# Patient Record
Sex: Female | Born: 1965 | Race: White | Hispanic: No | Marital: Married | Smoking: Never smoker
Health system: Southern US, Community
[De-identification: ages and names within clinical notes are randomized; demographics above are authoritative.]

## PROBLEM LIST (undated history)

## (undated) DIAGNOSIS — J45909 Unspecified asthma, uncomplicated: Secondary | ICD-10-CM

## (undated) DIAGNOSIS — C801 Malignant (primary) neoplasm, unspecified: Secondary | ICD-10-CM

---

## 2007-12-28 ENCOUNTER — Other Ambulatory Visit: Admission: RE | Admit: 2007-12-28 | Discharge: 2007-12-28 | Payer: Self-pay | Admitting: Family Medicine

## 2009-04-26 ENCOUNTER — Other Ambulatory Visit: Admission: RE | Admit: 2009-04-26 | Discharge: 2009-04-26 | Payer: Self-pay | Admitting: Family Medicine

## 2010-08-14 ENCOUNTER — Other Ambulatory Visit: Admission: RE | Admit: 2010-08-14 | Discharge: 2010-08-14 | Payer: Self-pay | Admitting: Family Medicine

## 2010-12-16 ENCOUNTER — Other Ambulatory Visit: Payer: Self-pay | Admitting: Family Medicine

## 2010-12-16 DIAGNOSIS — Z1239 Encounter for other screening for malignant neoplasm of breast: Secondary | ICD-10-CM

## 2010-12-16 DIAGNOSIS — Z1231 Encounter for screening mammogram for malignant neoplasm of breast: Secondary | ICD-10-CM

## 2010-12-25 ENCOUNTER — Ambulatory Visit
Admission: RE | Admit: 2010-12-25 | Discharge: 2010-12-25 | Disposition: A | Discharge status: Home / Self Care | Payer: BC Managed Care – PPO | Source: Ambulatory Visit | Attending: Family Medicine | Admitting: Family Medicine

## 2010-12-25 DIAGNOSIS — Z1231 Encounter for screening mammogram for malignant neoplasm of breast: Secondary | ICD-10-CM

## 2012-05-24 ENCOUNTER — Other Ambulatory Visit (HOSPITAL_COMMUNITY)
Admission: RE | Admit: 2012-05-24 | Discharge: 2012-05-24 | Disposition: A | Discharge status: Home / Self Care | Payer: 59 | Source: Ambulatory Visit | Attending: Family Medicine | Admitting: Family Medicine

## 2012-05-24 DIAGNOSIS — Z124 Encounter for screening for malignant neoplasm of cervix: Secondary | ICD-10-CM | POA: Insufficient documentation

## 2012-05-24 DIAGNOSIS — Z1159 Encounter for screening for other viral diseases: Secondary | ICD-10-CM | POA: Insufficient documentation

## 2012-08-30 ENCOUNTER — Other Ambulatory Visit: Payer: Self-pay | Admitting: Family Medicine

## 2012-08-30 DIAGNOSIS — Z1231 Encounter for screening mammogram for malignant neoplasm of breast: Secondary | ICD-10-CM

## 2012-10-07 ENCOUNTER — Ambulatory Visit
Admission: RE | Admit: 2012-10-07 | Discharge: 2012-10-07 | Disposition: A | Discharge status: Home / Self Care | Payer: 59 | Source: Ambulatory Visit | Attending: Family Medicine | Admitting: Family Medicine

## 2012-10-07 DIAGNOSIS — Z1231 Encounter for screening mammogram for malignant neoplasm of breast: Secondary | ICD-10-CM

## 2014-11-21 ENCOUNTER — Other Ambulatory Visit: Payer: Self-pay | Admitting: Family Medicine

## 2014-11-21 DIAGNOSIS — R609 Edema, unspecified: Secondary | ICD-10-CM

## 2014-11-22 ENCOUNTER — Ambulatory Visit
Admission: RE | Admit: 2014-11-22 | Discharge: 2014-11-22 | Disposition: A | Discharge status: Home / Self Care | Payer: BLUE CROSS/BLUE SHIELD | Source: Ambulatory Visit | Attending: Family Medicine | Admitting: Family Medicine

## 2014-11-22 DIAGNOSIS — R609 Edema, unspecified: Secondary | ICD-10-CM

## 2016-02-27 ENCOUNTER — Other Ambulatory Visit: Payer: Self-pay

## 2016-02-27 DIAGNOSIS — Z1231 Encounter for screening mammogram for malignant neoplasm of breast: Secondary | ICD-10-CM

## 2016-03-06 DIAGNOSIS — L820 Inflamed seborrheic keratosis: Secondary | ICD-10-CM | POA: Diagnosis not present

## 2016-03-19 ENCOUNTER — Ambulatory Visit
Admission: RE | Admit: 2016-03-19 | Discharge: 2016-03-19 | Disposition: A | Discharge status: Home / Self Care | Payer: BLUE CROSS/BLUE SHIELD | Source: Ambulatory Visit

## 2016-03-19 DIAGNOSIS — Z1231 Encounter for screening mammogram for malignant neoplasm of breast: Secondary | ICD-10-CM | POA: Diagnosis not present

## 2016-03-21 ENCOUNTER — Other Ambulatory Visit: Payer: Self-pay | Admitting: Family Medicine

## 2016-03-21 DIAGNOSIS — R928 Other abnormal and inconclusive findings on diagnostic imaging of breast: Secondary | ICD-10-CM

## 2016-04-01 ENCOUNTER — Ambulatory Visit
Admission: RE | Admit: 2016-04-01 | Discharge: 2016-04-01 | Disposition: A | Discharge status: Home / Self Care | Payer: BLUE CROSS/BLUE SHIELD | Source: Ambulatory Visit | Attending: Family Medicine | Admitting: Family Medicine

## 2016-04-01 DIAGNOSIS — N6002 Solitary cyst of left breast: Secondary | ICD-10-CM | POA: Diagnosis not present

## 2016-04-01 DIAGNOSIS — R928 Other abnormal and inconclusive findings on diagnostic imaging of breast: Secondary | ICD-10-CM

## 2016-04-01 DIAGNOSIS — N63 Unspecified lump in breast: Secondary | ICD-10-CM | POA: Diagnosis not present

## 2016-04-30 DIAGNOSIS — M79662 Pain in left lower leg: Secondary | ICD-10-CM | POA: Diagnosis not present

## 2016-04-30 DIAGNOSIS — M25562 Pain in left knee: Secondary | ICD-10-CM | POA: Diagnosis not present

## 2016-04-30 DIAGNOSIS — M79605 Pain in left leg: Secondary | ICD-10-CM | POA: Diagnosis not present

## 2016-06-02 DIAGNOSIS — M79662 Pain in left lower leg: Secondary | ICD-10-CM | POA: Diagnosis not present

## 2016-06-02 DIAGNOSIS — M25562 Pain in left knee: Secondary | ICD-10-CM | POA: Diagnosis not present

## 2016-06-10 DIAGNOSIS — M25362 Other instability, left knee: Secondary | ICD-10-CM | POA: Diagnosis not present

## 2016-06-16 DIAGNOSIS — M25562 Pain in left knee: Secondary | ICD-10-CM | POA: Diagnosis not present

## 2016-06-16 DIAGNOSIS — M25362 Other instability, left knee: Secondary | ICD-10-CM | POA: Diagnosis not present

## 2016-06-16 DIAGNOSIS — M23352 Other meniscus derangements, posterior horn of lateral meniscus, left knee: Secondary | ICD-10-CM | POA: Diagnosis not present

## 2016-07-07 DIAGNOSIS — M25362 Other instability, left knee: Secondary | ICD-10-CM | POA: Diagnosis not present

## 2016-07-07 DIAGNOSIS — M23352 Other meniscus derangements, posterior horn of lateral meniscus, left knee: Secondary | ICD-10-CM | POA: Diagnosis not present

## 2016-07-07 DIAGNOSIS — M25562 Pain in left knee: Secondary | ICD-10-CM | POA: Diagnosis not present

## 2016-07-17 DIAGNOSIS — D1800 Hemangioma unspecified site: Secondary | ICD-10-CM | POA: Diagnosis not present

## 2016-07-17 DIAGNOSIS — L814 Other melanin hyperpigmentation: Secondary | ICD-10-CM | POA: Diagnosis not present

## 2016-07-17 DIAGNOSIS — D224 Melanocytic nevi of scalp and neck: Secondary | ICD-10-CM | POA: Diagnosis not present

## 2016-07-17 DIAGNOSIS — Z86018 Personal history of other benign neoplasm: Secondary | ICD-10-CM | POA: Diagnosis not present

## 2016-07-25 IMAGING — US US BREAST*L* LIMITED INC AXILLA
1 series · 5 of 5 positions shown · non-contrast
Comparison: 03/19/2016 and earlier priors

CLINICAL DATA: Mass identified on recent 2D screening mammogram.

EXAM:
2D DIGITAL DIAGNOSTIC LEFT MAMMOGRAM WITH CAD AND ADJUNCT TOMO
ULTRASOUND LEFT BREAST

[Series 1: us breast*left* limited inc axilla · 0.07mm/px · 5 of 5 slices shown]
[im 1/5]
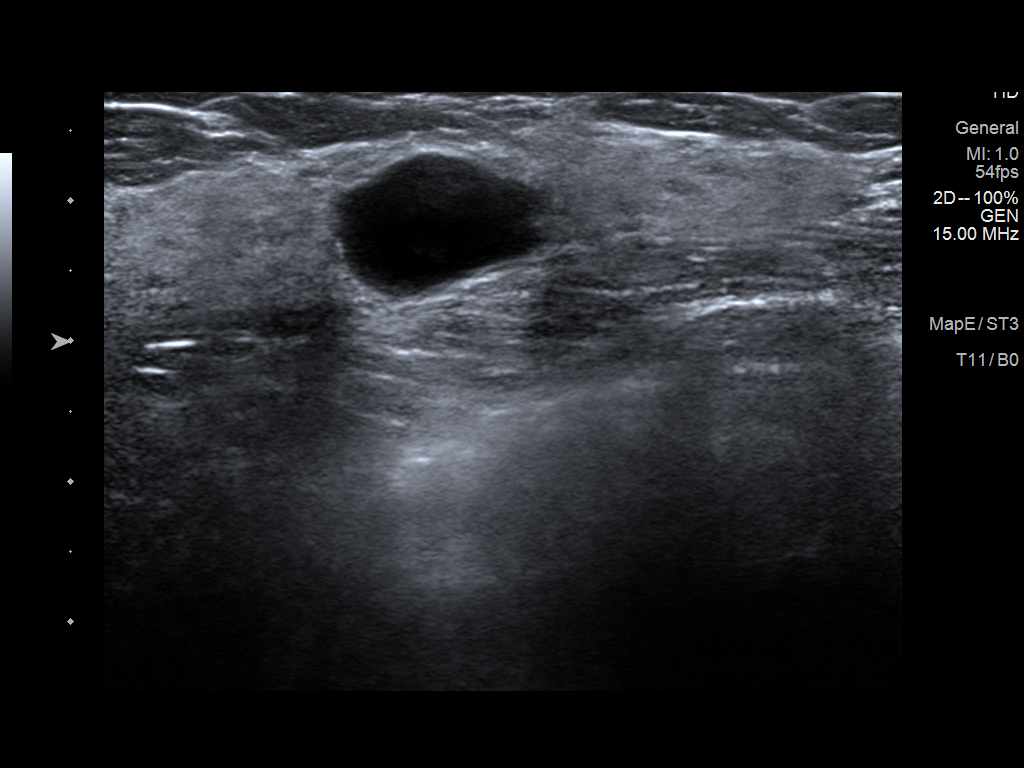
[im 2/5]
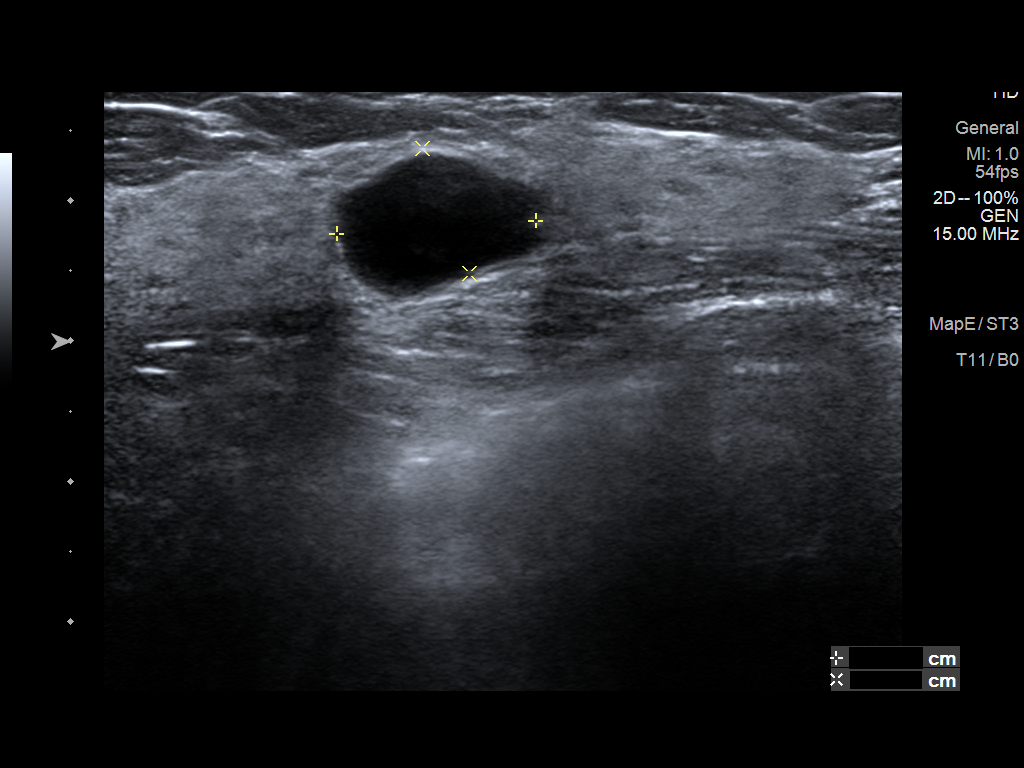
[im 3/5]
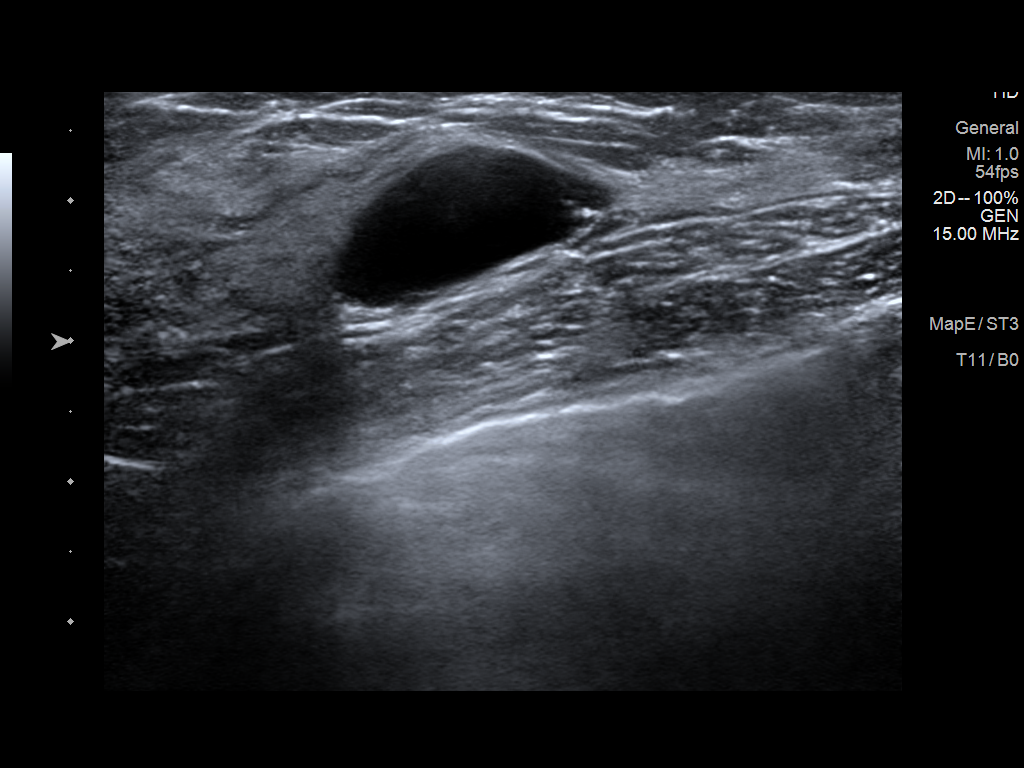
[im 4/5]
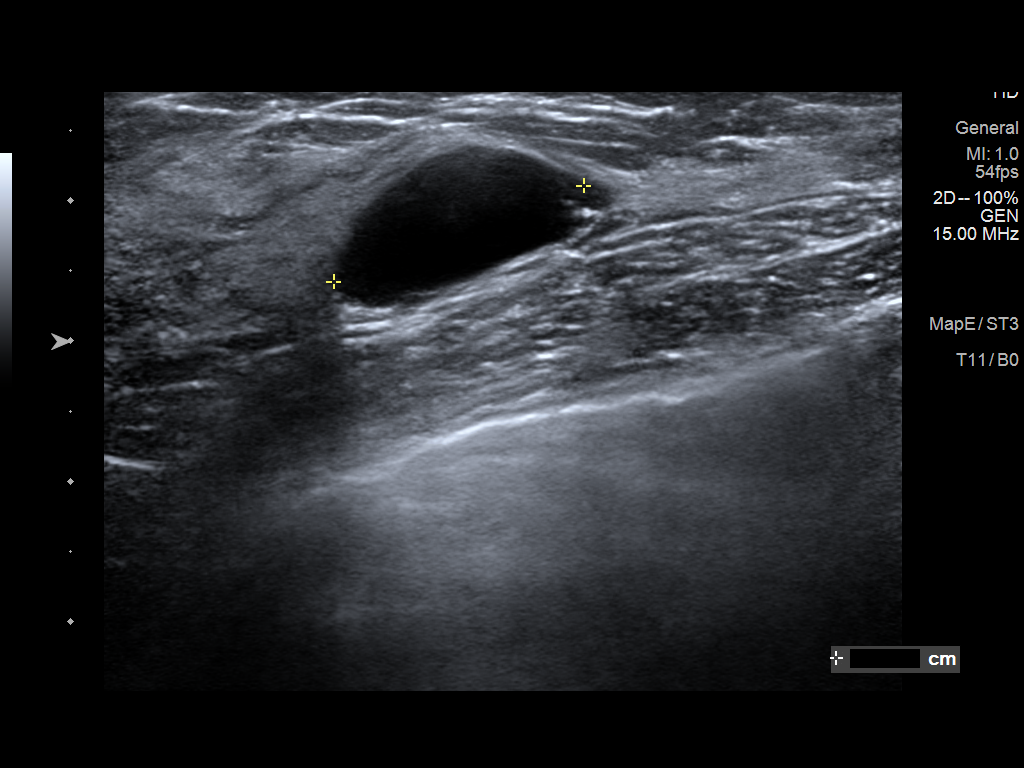
[im 5/5]
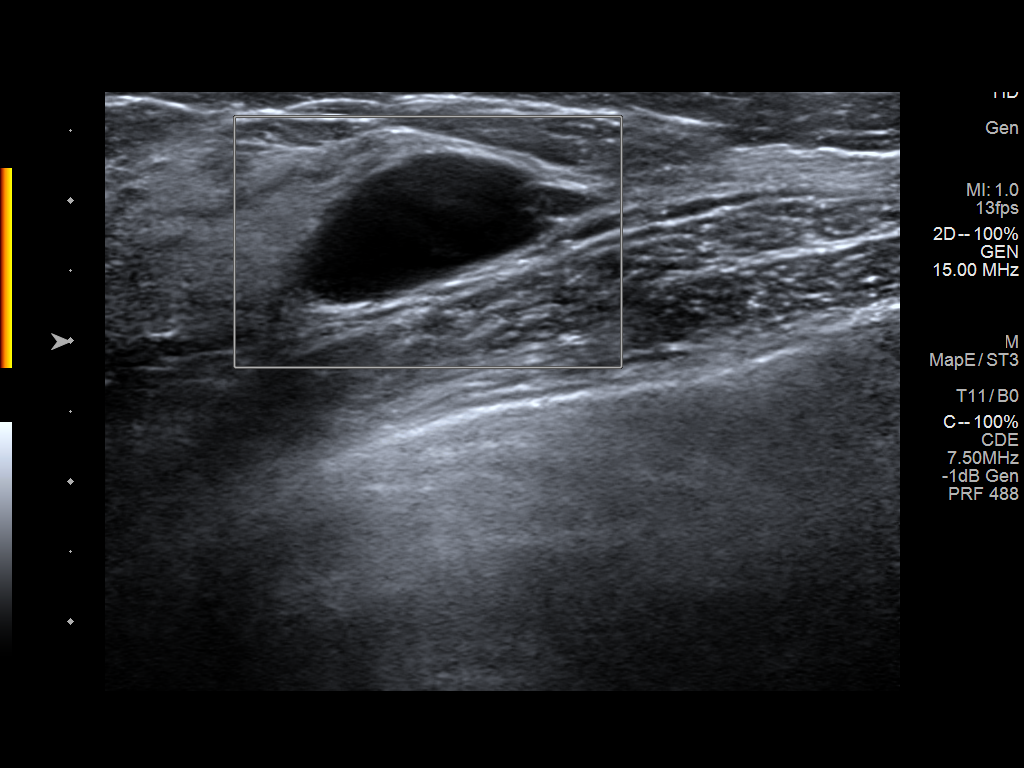

[5 of 5 positions shown; findings below may reference images not displayed]

ACR Breast Density Category c: The breast tissue is heterogeneously
dense, which may obscure small masses.
FINDINGS: Whole breast 3D images in MLO and CC projections and a 2D
exaggerated CC lateral projection of the left breast are performed.
There is a 20 mm oval circumscribed mass in the far outer left
breast. No distortion or additional masses are identified.

Mammographic images were processed with CAD.

On physical exam, there is a subtle, smooth palpable lump at [DATE]
position 5 cm from the nipple.

Targeted ultrasound is performed, showing an oval simple cyst at
[DATE] position 5 cm from the nipple measuring 19 x 14 x 10 mm.
IMPRESSION: Simple cysts left breast at [DATE] position. No evidence of malignancy
in the left breast.

RECOMMENDATION:
Screening mammogram in one year.(Code:[id number])

I have discussed the findings and recommendations with the patient.
Results were also provided in writing at the conclusion of the
visit. If applicable, a reminder letter will be sent to the patient
regarding the next appointment.

BI-RADS CATEGORY  2: Benign.

## 2017-01-15 DIAGNOSIS — Z6823 Body mass index (BMI) 23.0-23.9, adult: Secondary | ICD-10-CM | POA: Diagnosis not present

## 2017-01-15 DIAGNOSIS — N951 Menopausal and female climacteric states: Secondary | ICD-10-CM | POA: Diagnosis not present

## 2017-01-15 DIAGNOSIS — Z3009 Encounter for other general counseling and advice on contraception: Secondary | ICD-10-CM | POA: Diagnosis not present

## 2017-03-06 DIAGNOSIS — R5383 Other fatigue: Secondary | ICD-10-CM | POA: Diagnosis not present

## 2017-04-14 DIAGNOSIS — N840 Polyp of corpus uteri: Secondary | ICD-10-CM | POA: Diagnosis not present

## 2017-04-14 DIAGNOSIS — N938 Other specified abnormal uterine and vaginal bleeding: Secondary | ICD-10-CM | POA: Diagnosis not present

## 2018-04-01 DIAGNOSIS — Z23 Encounter for immunization: Secondary | ICD-10-CM | POA: Diagnosis not present

## 2018-04-01 DIAGNOSIS — D229 Melanocytic nevi, unspecified: Secondary | ICD-10-CM | POA: Diagnosis not present

## 2018-04-01 DIAGNOSIS — Z79899 Other long term (current) drug therapy: Secondary | ICD-10-CM | POA: Diagnosis not present

## 2018-04-01 DIAGNOSIS — J3089 Other allergic rhinitis: Secondary | ICD-10-CM | POA: Diagnosis not present

## 2018-04-01 DIAGNOSIS — F338 Other recurrent depressive disorders: Secondary | ICD-10-CM | POA: Diagnosis not present

## 2018-04-01 DIAGNOSIS — J4530 Mild persistent asthma, uncomplicated: Secondary | ICD-10-CM | POA: Diagnosis not present

## 2018-04-01 DIAGNOSIS — E7800 Pure hypercholesterolemia, unspecified: Secondary | ICD-10-CM | POA: Diagnosis not present

## 2018-04-23 DIAGNOSIS — Z6827 Body mass index (BMI) 27.0-27.9, adult: Secondary | ICD-10-CM | POA: Diagnosis not present

## 2018-04-23 DIAGNOSIS — R87610 Atypical squamous cells of undetermined significance on cytologic smear of cervix (ASC-US): Secondary | ICD-10-CM | POA: Diagnosis not present

## 2018-04-23 DIAGNOSIS — Z01419 Encounter for gynecological examination (general) (routine) without abnormal findings: Secondary | ICD-10-CM | POA: Diagnosis not present

## 2018-04-23 DIAGNOSIS — Z1231 Encounter for screening mammogram for malignant neoplasm of breast: Secondary | ICD-10-CM | POA: Diagnosis not present

## 2018-05-11 DIAGNOSIS — N840 Polyp of corpus uteri: Secondary | ICD-10-CM | POA: Diagnosis not present

## 2018-05-11 DIAGNOSIS — N926 Irregular menstruation, unspecified: Secondary | ICD-10-CM | POA: Diagnosis not present

## 2018-05-11 DIAGNOSIS — Z3202 Encounter for pregnancy test, result negative: Secondary | ICD-10-CM | POA: Diagnosis not present

## 2018-05-31 DIAGNOSIS — R87611 Atypical squamous cells cannot exclude high grade squamous intraepithelial lesion on cytologic smear of cervix (ASC-H): Secondary | ICD-10-CM | POA: Diagnosis not present

## 2018-05-31 DIAGNOSIS — R87810 Cervical high risk human papillomavirus (HPV) DNA test positive: Secondary | ICD-10-CM | POA: Diagnosis not present

## 2018-06-01 DIAGNOSIS — R87610 Atypical squamous cells of undetermined significance on cytologic smear of cervix (ASC-US): Secondary | ICD-10-CM | POA: Diagnosis not present

## 2018-12-10 DIAGNOSIS — R87610 Atypical squamous cells of undetermined significance on cytologic smear of cervix (ASC-US): Secondary | ICD-10-CM | POA: Diagnosis not present

## 2019-04-28 DIAGNOSIS — Z6824 Body mass index (BMI) 24.0-24.9, adult: Secondary | ICD-10-CM | POA: Diagnosis not present

## 2019-04-28 DIAGNOSIS — Z01419 Encounter for gynecological examination (general) (routine) without abnormal findings: Secondary | ICD-10-CM | POA: Diagnosis not present

## 2019-04-28 DIAGNOSIS — Z1151 Encounter for screening for human papillomavirus (HPV): Secondary | ICD-10-CM | POA: Diagnosis not present

## 2019-04-28 DIAGNOSIS — Z1231 Encounter for screening mammogram for malignant neoplasm of breast: Secondary | ICD-10-CM | POA: Diagnosis not present

## 2019-05-12 DIAGNOSIS — N6342 Unspecified lump in left breast, subareolar: Secondary | ICD-10-CM | POA: Diagnosis not present

## 2019-05-12 DIAGNOSIS — N6322 Unspecified lump in the left breast, upper inner quadrant: Secondary | ICD-10-CM | POA: Diagnosis not present

## 2019-05-12 DIAGNOSIS — R928 Other abnormal and inconclusive findings on diagnostic imaging of breast: Secondary | ICD-10-CM | POA: Diagnosis not present

## 2019-05-12 DIAGNOSIS — N6489 Other specified disorders of breast: Secondary | ICD-10-CM | POA: Diagnosis not present

## 2019-05-12 DIAGNOSIS — R922 Inconclusive mammogram: Secondary | ICD-10-CM | POA: Diagnosis not present

## 2019-05-19 DIAGNOSIS — N6322 Unspecified lump in the left breast, upper inner quadrant: Secondary | ICD-10-CM | POA: Diagnosis not present

## 2019-05-19 DIAGNOSIS — C50912 Malignant neoplasm of unspecified site of left female breast: Secondary | ICD-10-CM | POA: Diagnosis not present

## 2019-05-19 DIAGNOSIS — Z170 Estrogen receptor positive status [ER+]: Secondary | ICD-10-CM | POA: Diagnosis not present

## 2019-05-19 DIAGNOSIS — C50812 Malignant neoplasm of overlapping sites of left female breast: Secondary | ICD-10-CM | POA: Diagnosis not present

## 2019-05-19 DIAGNOSIS — R928 Other abnormal and inconclusive findings on diagnostic imaging of breast: Secondary | ICD-10-CM | POA: Diagnosis not present

## 2019-05-19 DIAGNOSIS — N6002 Solitary cyst of left breast: Secondary | ICD-10-CM | POA: Diagnosis not present

## 2019-05-19 DIAGNOSIS — N6325 Unspecified lump in the left breast, overlapping quadrants: Secondary | ICD-10-CM | POA: Diagnosis not present

## 2019-05-19 DIAGNOSIS — C50412 Malignant neoplasm of upper-outer quadrant of left female breast: Secondary | ICD-10-CM | POA: Diagnosis not present

## 2019-05-27 DIAGNOSIS — C50912 Malignant neoplasm of unspecified site of left female breast: Secondary | ICD-10-CM | POA: Diagnosis not present

## 2019-05-30 DIAGNOSIS — Z170 Estrogen receptor positive status [ER+]: Secondary | ICD-10-CM | POA: Diagnosis not present

## 2019-05-30 DIAGNOSIS — C50919 Malignant neoplasm of unspecified site of unspecified female breast: Secondary | ICD-10-CM | POA: Diagnosis not present

## 2019-05-30 DIAGNOSIS — C50912 Malignant neoplasm of unspecified site of left female breast: Secondary | ICD-10-CM | POA: Diagnosis not present

## 2019-05-31 ENCOUNTER — Other Ambulatory Visit: Payer: Self-pay | Admitting: Hematology

## 2019-06-01 DIAGNOSIS — C50919 Malignant neoplasm of unspecified site of unspecified female breast: Secondary | ICD-10-CM | POA: Diagnosis not present

## 2019-06-01 DIAGNOSIS — C50912 Malignant neoplasm of unspecified site of left female breast: Secondary | ICD-10-CM | POA: Diagnosis not present

## 2019-06-01 DIAGNOSIS — Z803 Family history of malignant neoplasm of breast: Secondary | ICD-10-CM | POA: Diagnosis not present

## 2019-06-03 DIAGNOSIS — Z853 Personal history of malignant neoplasm of breast: Secondary | ICD-10-CM | POA: Diagnosis not present

## 2019-06-03 DIAGNOSIS — C50912 Malignant neoplasm of unspecified site of left female breast: Secondary | ICD-10-CM | POA: Diagnosis not present

## 2019-06-14 DIAGNOSIS — Z01812 Encounter for preprocedural laboratory examination: Secondary | ICD-10-CM | POA: Diagnosis not present

## 2019-06-14 DIAGNOSIS — F329 Major depressive disorder, single episode, unspecified: Secondary | ICD-10-CM | POA: Diagnosis not present

## 2019-06-14 DIAGNOSIS — Z22321 Carrier or suspected carrier of Methicillin susceptible Staphylococcus aureus: Secondary | ICD-10-CM | POA: Diagnosis not present

## 2019-06-14 DIAGNOSIS — Z86718 Personal history of other venous thrombosis and embolism: Secondary | ICD-10-CM | POA: Diagnosis not present

## 2019-06-14 DIAGNOSIS — C50912 Malignant neoplasm of unspecified site of left female breast: Secondary | ICD-10-CM | POA: Diagnosis not present

## 2019-06-14 DIAGNOSIS — Z01818 Encounter for other preprocedural examination: Secondary | ICD-10-CM | POA: Diagnosis not present

## 2019-06-14 DIAGNOSIS — Z01810 Encounter for preprocedural cardiovascular examination: Secondary | ICD-10-CM | POA: Diagnosis not present

## 2019-06-14 DIAGNOSIS — J4520 Mild intermittent asthma, uncomplicated: Secondary | ICD-10-CM | POA: Diagnosis not present

## 2019-06-20 DIAGNOSIS — Z1159 Encounter for screening for other viral diseases: Secondary | ICD-10-CM | POA: Diagnosis not present

## 2019-06-20 DIAGNOSIS — J45909 Unspecified asthma, uncomplicated: Secondary | ICD-10-CM | POA: Diagnosis not present

## 2019-06-20 DIAGNOSIS — C50912 Malignant neoplasm of unspecified site of left female breast: Secondary | ICD-10-CM | POA: Diagnosis not present

## 2019-06-20 DIAGNOSIS — F418 Other specified anxiety disorders: Secondary | ICD-10-CM | POA: Diagnosis not present

## 2019-06-20 DIAGNOSIS — Z01812 Encounter for preprocedural laboratory examination: Secondary | ICD-10-CM | POA: Diagnosis not present

## 2019-06-24 DIAGNOSIS — Z853 Personal history of malignant neoplasm of breast: Secondary | ICD-10-CM | POA: Diagnosis not present

## 2019-06-24 DIAGNOSIS — J45909 Unspecified asthma, uncomplicated: Secondary | ICD-10-CM | POA: Diagnosis not present

## 2019-06-24 DIAGNOSIS — F329 Major depressive disorder, single episode, unspecified: Secondary | ICD-10-CM | POA: Diagnosis not present

## 2019-06-24 DIAGNOSIS — C773 Secondary and unspecified malignant neoplasm of axilla and upper limb lymph nodes: Secondary | ICD-10-CM | POA: Diagnosis not present

## 2019-06-24 DIAGNOSIS — C50912 Malignant neoplasm of unspecified site of left female breast: Secondary | ICD-10-CM | POA: Diagnosis not present

## 2019-06-24 DIAGNOSIS — D0512 Intraductal carcinoma in situ of left breast: Secondary | ICD-10-CM | POA: Diagnosis not present

## 2019-06-24 DIAGNOSIS — Z86718 Personal history of other venous thrombosis and embolism: Secondary | ICD-10-CM | POA: Diagnosis not present

## 2019-06-24 DIAGNOSIS — C50212 Malignant neoplasm of upper-inner quadrant of left female breast: Secondary | ICD-10-CM | POA: Diagnosis not present

## 2019-06-24 DIAGNOSIS — Z79899 Other long term (current) drug therapy: Secondary | ICD-10-CM | POA: Diagnosis not present

## 2019-06-24 DIAGNOSIS — F419 Anxiety disorder, unspecified: Secondary | ICD-10-CM | POA: Diagnosis not present

## 2019-06-24 DIAGNOSIS — C50812 Malignant neoplasm of overlapping sites of left female breast: Secondary | ICD-10-CM | POA: Diagnosis not present

## 2019-06-24 DIAGNOSIS — Z803 Family history of malignant neoplasm of breast: Secondary | ICD-10-CM | POA: Diagnosis not present

## 2019-06-24 DIAGNOSIS — Z170 Estrogen receptor positive status [ER+]: Secondary | ICD-10-CM | POA: Diagnosis not present

## 2019-06-24 HISTORY — PX: MASTECTOMY: SHX3

## 2019-06-25 DIAGNOSIS — C773 Secondary and unspecified malignant neoplasm of axilla and upper limb lymph nodes: Secondary | ICD-10-CM | POA: Diagnosis not present

## 2019-06-25 DIAGNOSIS — Z79899 Other long term (current) drug therapy: Secondary | ICD-10-CM | POA: Diagnosis not present

## 2019-06-25 DIAGNOSIS — Z170 Estrogen receptor positive status [ER+]: Secondary | ICD-10-CM | POA: Diagnosis not present

## 2019-06-25 DIAGNOSIS — Z86718 Personal history of other venous thrombosis and embolism: Secondary | ICD-10-CM | POA: Diagnosis not present

## 2019-06-25 DIAGNOSIS — C50912 Malignant neoplasm of unspecified site of left female breast: Secondary | ICD-10-CM | POA: Diagnosis not present

## 2019-06-25 DIAGNOSIS — F329 Major depressive disorder, single episode, unspecified: Secondary | ICD-10-CM | POA: Diagnosis not present

## 2019-06-25 DIAGNOSIS — Z803 Family history of malignant neoplasm of breast: Secondary | ICD-10-CM | POA: Diagnosis not present

## 2019-06-25 DIAGNOSIS — F419 Anxiety disorder, unspecified: Secondary | ICD-10-CM | POA: Diagnosis not present

## 2019-06-25 DIAGNOSIS — J45909 Unspecified asthma, uncomplicated: Secondary | ICD-10-CM | POA: Diagnosis not present

## 2019-06-27 DIAGNOSIS — C50912 Malignant neoplasm of unspecified site of left female breast: Secondary | ICD-10-CM | POA: Diagnosis not present

## 2019-07-01 DIAGNOSIS — I82442 Acute embolism and thrombosis of left tibial vein: Secondary | ICD-10-CM | POA: Diagnosis not present

## 2019-07-01 DIAGNOSIS — I82409 Acute embolism and thrombosis of unspecified deep veins of unspecified lower extremity: Secondary | ICD-10-CM | POA: Diagnosis not present

## 2019-07-01 DIAGNOSIS — I82432 Acute embolism and thrombosis of left popliteal vein: Secondary | ICD-10-CM | POA: Diagnosis not present

## 2019-07-01 DIAGNOSIS — I82412 Acute embolism and thrombosis of left femoral vein: Secondary | ICD-10-CM | POA: Diagnosis not present

## 2019-07-06 DIAGNOSIS — G478 Other sleep disorders: Secondary | ICD-10-CM | POA: Diagnosis not present

## 2019-07-06 DIAGNOSIS — Z9012 Acquired absence of left breast and nipple: Secondary | ICD-10-CM | POA: Diagnosis not present

## 2019-07-06 DIAGNOSIS — M25512 Pain in left shoulder: Secondary | ICD-10-CM | POA: Diagnosis not present

## 2019-07-06 DIAGNOSIS — R29898 Other symptoms and signs involving the musculoskeletal system: Secondary | ICD-10-CM | POA: Diagnosis not present

## 2019-07-07 DIAGNOSIS — C50812 Malignant neoplasm of overlapping sites of left female breast: Secondary | ICD-10-CM | POA: Diagnosis not present

## 2019-07-07 DIAGNOSIS — Z9012 Acquired absence of left breast and nipple: Secondary | ICD-10-CM | POA: Diagnosis not present

## 2019-07-07 DIAGNOSIS — C50011 Malignant neoplasm of nipple and areola, right female breast: Secondary | ICD-10-CM | POA: Diagnosis not present

## 2019-07-07 DIAGNOSIS — N644 Mastodynia: Secondary | ICD-10-CM | POA: Diagnosis not present

## 2019-07-07 DIAGNOSIS — Z170 Estrogen receptor positive status [ER+]: Secondary | ICD-10-CM | POA: Diagnosis not present

## 2019-07-08 DIAGNOSIS — C773 Secondary and unspecified malignant neoplasm of axilla and upper limb lymph nodes: Secondary | ICD-10-CM | POA: Diagnosis not present

## 2019-07-08 DIAGNOSIS — C50912 Malignant neoplasm of unspecified site of left female breast: Secondary | ICD-10-CM | POA: Diagnosis not present

## 2019-07-08 DIAGNOSIS — Z9012 Acquired absence of left breast and nipple: Secondary | ICD-10-CM | POA: Diagnosis not present

## 2019-07-13 DIAGNOSIS — R29898 Other symptoms and signs involving the musculoskeletal system: Secondary | ICD-10-CM | POA: Diagnosis not present

## 2019-07-13 DIAGNOSIS — M25512 Pain in left shoulder: Secondary | ICD-10-CM | POA: Diagnosis not present

## 2019-07-13 DIAGNOSIS — Z9012 Acquired absence of left breast and nipple: Secondary | ICD-10-CM | POA: Diagnosis not present

## 2019-07-13 DIAGNOSIS — G478 Other sleep disorders: Secondary | ICD-10-CM | POA: Diagnosis not present

## 2019-07-15 DIAGNOSIS — Z9012 Acquired absence of left breast and nipple: Secondary | ICD-10-CM | POA: Diagnosis not present

## 2019-07-15 DIAGNOSIS — C50912 Malignant neoplasm of unspecified site of left female breast: Secondary | ICD-10-CM | POA: Diagnosis not present

## 2019-07-18 DIAGNOSIS — H5310 Unspecified subjective visual disturbances: Secondary | ICD-10-CM | POA: Diagnosis not present

## 2019-07-20 DIAGNOSIS — Z170 Estrogen receptor positive status [ER+]: Secondary | ICD-10-CM | POA: Diagnosis not present

## 2019-07-20 DIAGNOSIS — C50912 Malignant neoplasm of unspecified site of left female breast: Secondary | ICD-10-CM | POA: Diagnosis not present

## 2019-07-21 DIAGNOSIS — R29898 Other symptoms and signs involving the musculoskeletal system: Secondary | ICD-10-CM | POA: Diagnosis not present

## 2019-07-21 DIAGNOSIS — Z9012 Acquired absence of left breast and nipple: Secondary | ICD-10-CM | POA: Diagnosis not present

## 2019-07-21 DIAGNOSIS — M25512 Pain in left shoulder: Secondary | ICD-10-CM | POA: Diagnosis not present

## 2019-07-26 ENCOUNTER — Other Ambulatory Visit: Payer: Self-pay | Admitting: Hematology

## 2019-07-26 DIAGNOSIS — J45909 Unspecified asthma, uncomplicated: Secondary | ICD-10-CM | POA: Diagnosis not present

## 2019-07-26 DIAGNOSIS — I824Y2 Acute embolism and thrombosis of unspecified deep veins of left proximal lower extremity: Secondary | ICD-10-CM | POA: Diagnosis not present

## 2019-07-26 DIAGNOSIS — C50912 Malignant neoplasm of unspecified site of left female breast: Secondary | ICD-10-CM | POA: Diagnosis not present

## 2019-07-26 DIAGNOSIS — Z86718 Personal history of other venous thrombosis and embolism: Secondary | ICD-10-CM | POA: Diagnosis not present

## 2019-07-26 DIAGNOSIS — Z79899 Other long term (current) drug therapy: Secondary | ICD-10-CM | POA: Diagnosis not present

## 2019-07-26 DIAGNOSIS — Z170 Estrogen receptor positive status [ER+]: Secondary | ICD-10-CM | POA: Diagnosis not present

## 2019-07-26 DIAGNOSIS — Z7901 Long term (current) use of anticoagulants: Secondary | ICD-10-CM | POA: Diagnosis not present

## 2019-07-28 DIAGNOSIS — R29898 Other symptoms and signs involving the musculoskeletal system: Secondary | ICD-10-CM | POA: Diagnosis not present

## 2019-07-28 DIAGNOSIS — M25512 Pain in left shoulder: Secondary | ICD-10-CM | POA: Diagnosis not present

## 2019-07-28 DIAGNOSIS — Z9012 Acquired absence of left breast and nipple: Secondary | ICD-10-CM | POA: Diagnosis not present

## 2019-07-29 ENCOUNTER — Telehealth: Payer: Self-pay | Admitting: Hematology

## 2019-07-29 NOTE — Telephone Encounter (Signed)
Spoke with patient confirmed 9/16 new patient visit. Date/time/self referred from [facility] per Dr. Burr Medico.
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
## 2019-08-01 DIAGNOSIS — C50812 Malignant neoplasm of overlapping sites of left female breast: Secondary | ICD-10-CM | POA: Insufficient documentation

## 2019-08-02 ENCOUNTER — Other Ambulatory Visit: Payer: Self-pay | Admitting: Hematology

## 2019-08-02 DIAGNOSIS — C770 Secondary and unspecified malignant neoplasm of lymph nodes of head, face and neck: Secondary | ICD-10-CM | POA: Diagnosis not present

## 2019-08-02 DIAGNOSIS — I82412 Acute embolism and thrombosis of left femoral vein: Secondary | ICD-10-CM | POA: Diagnosis not present

## 2019-08-02 DIAGNOSIS — C50912 Malignant neoplasm of unspecified site of left female breast: Secondary | ICD-10-CM | POA: Diagnosis not present

## 2019-08-03 ENCOUNTER — Other Ambulatory Visit: Payer: Self-pay

## 2019-08-03 ENCOUNTER — Inpatient Hospital Stay: Payer: BC Managed Care – PPO | Attending: Hematology | Admitting: Hematology

## 2019-08-03 ENCOUNTER — Encounter: Payer: Self-pay | Admitting: Hematology

## 2019-08-03 DIAGNOSIS — I82442 Acute embolism and thrombosis of left tibial vein: Secondary | ICD-10-CM

## 2019-08-03 DIAGNOSIS — Z7901 Long term (current) use of anticoagulants: Secondary | ICD-10-CM | POA: Insufficient documentation

## 2019-08-03 DIAGNOSIS — I82412 Acute embolism and thrombosis of left femoral vein: Secondary | ICD-10-CM | POA: Diagnosis not present

## 2019-08-03 DIAGNOSIS — Z9012 Acquired absence of left breast and nipple: Secondary | ICD-10-CM

## 2019-08-03 DIAGNOSIS — C773 Secondary and unspecified malignant neoplasm of axilla and upper limb lymph nodes: Secondary | ICD-10-CM | POA: Diagnosis not present

## 2019-08-03 DIAGNOSIS — Z23 Encounter for immunization: Secondary | ICD-10-CM | POA: Insufficient documentation

## 2019-08-03 DIAGNOSIS — I82432 Acute embolism and thrombosis of left popliteal vein: Secondary | ICD-10-CM

## 2019-08-03 DIAGNOSIS — Z170 Estrogen receptor positive status [ER+]: Secondary | ICD-10-CM

## 2019-08-03 DIAGNOSIS — Z5189 Encounter for other specified aftercare: Secondary | ICD-10-CM | POA: Diagnosis not present

## 2019-08-03 DIAGNOSIS — C50812 Malignant neoplasm of overlapping sites of left female breast: Secondary | ICD-10-CM

## 2019-08-03 DIAGNOSIS — F418 Other specified anxiety disorders: Secondary | ICD-10-CM | POA: Diagnosis not present

## 2019-08-03 DIAGNOSIS — Z803 Family history of malignant neoplasm of breast: Secondary | ICD-10-CM | POA: Diagnosis not present

## 2019-08-03 DIAGNOSIS — Z5111 Encounter for antineoplastic chemotherapy: Secondary | ICD-10-CM | POA: Insufficient documentation

## 2019-08-03 MED ORDER — INFLUENZA VAC SPLIT QUAD 0.5 ML IM SUSY
PREFILLED_SYRINGE | INTRAMUSCULAR | Status: AC
  Filled 2019-08-03: qty 0.5

## 2019-08-03 MED ORDER — INFLUENZA VAC SPLIT QUAD 0.5 ML IM SUSY
0.5000 mL | PREFILLED_SYRINGE | Freq: Once | INTRAMUSCULAR | Status: AC
  Administered 2019-08-03: 13:00:00 0.5 mL via INTRAMUSCULAR

## 2019-08-04 ENCOUNTER — Telehealth: Payer: Self-pay

## 2019-08-04 ENCOUNTER — Telehealth: Payer: Self-pay | Admitting: Hematology

## 2019-08-04 NOTE — Telephone Encounter (Signed)
Error - duplicate

## 2019-08-04 NOTE — Telephone Encounter (Signed)
No los per 9/16

## 2019-08-04 NOTE — Telephone Encounter (Signed)
Faxed OV note to Dr. Rex Kras fax [phone] and Dr. Jackson Latino [phone]

## 2019-08-06 NOTE — Progress Notes (Signed)
START ON PATHWAY REGIMEN - Breast     A cycle is every 21 days:     Docetaxel      Cyclophosphamide   **Always confirm dose/schedule in your pharmacy ordering system**  Patient Characteristics: Postoperative without Neoadjuvant Therapy (Pathologic Staging), Invasive Disease, Adjuvant Therapy, HER2 Negative/Unknown/Equivocal, ER Positive, Node Positive, Node Positive (1-3), Oncotyping Ordered, Oncotype Intermediate Risk (11 - 25), Chemotherapy  Preferred Therapeutic Status: Postoperative without Neoadjuvant Therapy (Pathologic Staging) AJCC Grade: G2 AJCC N Category: pN1a AJCC M Category: cM0 ER Status: Positive (+) AJCC 8 Stage Grouping: IA HER2 Status: Negative (-) Oncotype Dx Recurrence Score: 25 AJCC T Category: pT1c PR Status: Positive (+) Has this patient completed genomic testing<= Yes - Oncotype DX(R) Treatment Preferred: Chemotherapy Intent of Therapy: Curative Intent, Discussed with Patient

## 2019-08-08 ENCOUNTER — Telehealth: Payer: Self-pay | Admitting: *Deleted

## 2019-08-08 NOTE — Telephone Encounter (Signed)
Spoke with patient to assess navigation needs and discuss dignicap.  Gave instructions on next steps with dignicap and answered questions. Gave information on hairtostay and hopeforhair Patient will start the process and order her kit and card.  Informed her that if she does not receive her kit and card before Friday 9/25 that we have extra and she can replace it when hers arrives.  Patient verbalized understanding.  Scheduled chemo education class for 9/22 at 2pm.  Contact information given and encouraged her to call with any questions or needs.

## 2019-08-09 ENCOUNTER — Inpatient Hospital Stay: Payer: BC Managed Care – PPO

## 2019-08-09 ENCOUNTER — Other Ambulatory Visit: Payer: Self-pay

## 2019-08-09 ENCOUNTER — Encounter: Payer: Self-pay | Admitting: Hematology

## 2019-08-09 ENCOUNTER — Other Ambulatory Visit: Payer: Self-pay | Admitting: *Deleted

## 2019-08-09 DIAGNOSIS — Z5189 Encounter for other specified aftercare: Secondary | ICD-10-CM | POA: Diagnosis not present

## 2019-08-09 DIAGNOSIS — F418 Other specified anxiety disorders: Secondary | ICD-10-CM | POA: Diagnosis not present

## 2019-08-09 DIAGNOSIS — C50812 Malignant neoplasm of overlapping sites of left female breast: Secondary | ICD-10-CM

## 2019-08-09 DIAGNOSIS — Z170 Estrogen receptor positive status [ER+]: Secondary | ICD-10-CM

## 2019-08-09 DIAGNOSIS — I82432 Acute embolism and thrombosis of left popliteal vein: Secondary | ICD-10-CM | POA: Diagnosis not present

## 2019-08-09 DIAGNOSIS — Z23 Encounter for immunization: Secondary | ICD-10-CM | POA: Diagnosis not present

## 2019-08-09 DIAGNOSIS — I82412 Acute embolism and thrombosis of left femoral vein: Secondary | ICD-10-CM | POA: Diagnosis not present

## 2019-08-09 DIAGNOSIS — I82442 Acute embolism and thrombosis of left tibial vein: Secondary | ICD-10-CM | POA: Diagnosis not present

## 2019-08-09 DIAGNOSIS — Z7901 Long term (current) use of anticoagulants: Secondary | ICD-10-CM | POA: Diagnosis not present

## 2019-08-09 DIAGNOSIS — C773 Secondary and unspecified malignant neoplasm of axilla and upper limb lymph nodes: Secondary | ICD-10-CM | POA: Diagnosis not present

## 2019-08-09 DIAGNOSIS — Z803 Family history of malignant neoplasm of breast: Secondary | ICD-10-CM | POA: Diagnosis not present

## 2019-08-09 DIAGNOSIS — Z5111 Encounter for antineoplastic chemotherapy: Secondary | ICD-10-CM | POA: Diagnosis not present

## 2019-08-09 LAB — CBC WITH DIFFERENTIAL (CANCER CENTER ONLY)
Abs Immature Granulocytes: 0.02 10*3/uL (ref 0.00–0.07)
Basophils Absolute: 0 10*3/uL (ref 0.0–0.1)
Basophils Relative: 0 %
Eosinophils Absolute: 0.1 10*3/uL (ref 0.0–0.5)
Eosinophils Relative: 3 %
HCT: 39.9 % (ref 36.0–46.0)
Hemoglobin: 12.9 g/dL (ref 12.0–15.0)
Immature Granulocytes: 0 %
Lymphocytes Relative: 31 %
Lymphs Abs: 1.5 10*3/uL (ref 0.7–4.0)
MCH: 30 pg (ref 26.0–34.0)
MCHC: 32.3 g/dL (ref 30.0–36.0)
MCV: 92.8 fL (ref 80.0–100.0)
Monocytes Absolute: 0.5 10*3/uL (ref 0.1–1.0)
Monocytes Relative: 10 %
Neutro Abs: 2.7 10*3/uL (ref 1.7–7.7)
Neutrophils Relative %: 56 %
Platelet Count: 249 10*3/uL (ref 150–400)
RBC: 4.3 MIL/uL (ref 3.87–5.11)
RDW: 13.2 % (ref 11.5–15.5)
WBC Count: 4.8 10*3/uL (ref 4.0–10.5)
nRBC: 0 % (ref 0.0–0.2)

## 2019-08-09 LAB — CMP (CANCER CENTER ONLY)
ALT: 26 U/L (ref 0–44)
AST: 19 U/L (ref 15–41)
Albumin: 4.3 g/dL (ref 3.5–5.0)
Alkaline Phosphatase: 103 U/L (ref 38–126)
Anion gap: 11 (ref 5–15)
BUN: 12 mg/dL (ref 6–20)
CO2: 27 mmol/L (ref 22–32)
Calcium: 9.4 mg/dL (ref 8.9–10.3)
Chloride: 104 mmol/L (ref 98–111)
Creatinine: 0.9 mg/dL (ref 0.44–1.00)
GFR, Est AFR Am: >60 mL/min (ref >60)
GFR, Estimated: >60 mL/min (ref >60)
Glucose, Bld: 121 mg/dL — ABNORMAL HIGH (ref 70–99)
Potassium: 4.5 mmol/L (ref 3.5–5.1)
Sodium: 142 mmol/L (ref 135–145)
Total Bilirubin: 0.3 mg/dL (ref 0.3–1.2)
Total Protein: 7 g/dL (ref 6.5–8.1)

## 2019-08-09 MED ORDER — PROCHLORPERAZINE MALEATE 10 MG PO TABS: 10.0000 mg | ORAL_TABLET | Freq: Four times a day (QID) | ORAL | 0 refills | Status: DC | PRN

## 2019-08-09 MED ORDER — ONDANSETRON HCL 8 MG PO TABS: 8.0000 mg | ORAL_TABLET | Freq: Two times a day (BID) | ORAL | 1 refills | Status: DC

## 2019-08-09 MED ORDER — DEXAMETHASONE 4 MG PO TABS: 4.0000 mg | ORAL_TABLET | Freq: Two times a day (BID) | ORAL | 1 refills | Status: DC

## 2019-08-09 NOTE — Progress Notes (Signed)
Met with patient before class to introduce myself as Arboriculturist and to offer available resources.  Discussed one-time $1000 Radio broadcast assistant to assist with personal expenses while going through treatment.  Also mentioned available copay assistance if needed for diagnosis/specifice drugs.  Gave her my card if interested in applying and for any additional financial questions or concerns.
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
## 2019-08-12 ENCOUNTER — Other Ambulatory Visit: Payer: Self-pay

## 2019-08-12 ENCOUNTER — Encounter: Payer: Self-pay | Admitting: Hematology

## 2019-08-12 ENCOUNTER — Inpatient Hospital Stay: Payer: BC Managed Care – PPO

## 2019-08-12 ENCOUNTER — Inpatient Hospital Stay (HOSPITAL_BASED_OUTPATIENT_CLINIC_OR_DEPARTMENT_OTHER): Payer: BC Managed Care – PPO | Admitting: Hematology

## 2019-08-12 VITALS — BP 126/80 | HR 65 | Temp 98.4°F | Resp 16

## 2019-08-12 DIAGNOSIS — Z170 Estrogen receptor positive status [ER+]: Secondary | ICD-10-CM

## 2019-08-12 DIAGNOSIS — Z803 Family history of malignant neoplasm of breast: Secondary | ICD-10-CM | POA: Diagnosis not present

## 2019-08-12 DIAGNOSIS — C50812 Malignant neoplasm of overlapping sites of left female breast: Secondary | ICD-10-CM | POA: Diagnosis not present

## 2019-08-12 DIAGNOSIS — Z23 Encounter for immunization: Secondary | ICD-10-CM | POA: Diagnosis not present

## 2019-08-12 DIAGNOSIS — F418 Other specified anxiety disorders: Secondary | ICD-10-CM | POA: Diagnosis not present

## 2019-08-12 DIAGNOSIS — Z5111 Encounter for antineoplastic chemotherapy: Secondary | ICD-10-CM | POA: Diagnosis not present

## 2019-08-12 DIAGNOSIS — Z7901 Long term (current) use of anticoagulants: Secondary | ICD-10-CM | POA: Diagnosis not present

## 2019-08-12 DIAGNOSIS — I82412 Acute embolism and thrombosis of left femoral vein: Secondary | ICD-10-CM | POA: Diagnosis not present

## 2019-08-12 DIAGNOSIS — C773 Secondary and unspecified malignant neoplasm of axilla and upper limb lymph nodes: Secondary | ICD-10-CM | POA: Diagnosis not present

## 2019-08-12 DIAGNOSIS — Z5189 Encounter for other specified aftercare: Secondary | ICD-10-CM | POA: Diagnosis not present

## 2019-08-12 DIAGNOSIS — I82432 Acute embolism and thrombosis of left popliteal vein: Secondary | ICD-10-CM | POA: Diagnosis not present

## 2019-08-12 DIAGNOSIS — I82442 Acute embolism and thrombosis of left tibial vein: Secondary | ICD-10-CM | POA: Diagnosis not present

## 2019-08-12 MED ORDER — SODIUM CHLORIDE 0.9 % IV SOLN
75.0000 mg/m2 | Freq: Once | INTRAVENOUS | Status: AC
  Administered 2019-08-12: 130 mg via INTRAVENOUS
  Filled 2019-08-12: qty 13

## 2019-08-12 MED ORDER — PALONOSETRON HCL INJECTION 0.25 MG/5ML
0.2500 mg | Freq: Once | INTRAVENOUS | Status: AC
  Administered 2019-08-12: 0.25 mg via INTRAVENOUS

## 2019-08-12 MED ORDER — DEXAMETHASONE SODIUM PHOSPHATE 10 MG/ML IJ SOLN
INTRAMUSCULAR | Status: AC
  Filled 2019-08-12: qty 1

## 2019-08-12 MED ORDER — PALONOSETRON HCL INJECTION 0.25 MG/5ML
INTRAVENOUS | Status: AC
  Filled 2019-08-12: qty 5

## 2019-08-12 MED ORDER — DEXAMETHASONE SODIUM PHOSPHATE 10 MG/ML IJ SOLN
10.0000 mg | Freq: Once | INTRAMUSCULAR | Status: AC
  Administered 2019-08-12: 11:00:00 10 mg via INTRAVENOUS

## 2019-08-12 MED ORDER — SODIUM CHLORIDE 0.9 % IV SOLN
Freq: Once | INTRAVENOUS | Status: AC
  Administered 2019-08-12: 11:00:00 via INTRAVENOUS
  Filled 2019-08-12: qty 250

## 2019-08-12 MED ORDER — SODIUM CHLORIDE 0.9 % IV SOLN
600.0000 mg/m2 | Freq: Once | INTRAVENOUS | Status: AC
  Administered 2019-08-12: 13:00:00 1060 mg via INTRAVENOUS
  Filled 2019-08-12: qty 53

## 2019-08-12 NOTE — Patient Instructions (Signed)
[place] Discharge Instructions for Patients Receiving Chemotherapy  Today you received the following chemotherapy agents: Taxotere and Cytoxan.  To help prevent nausea and vomiting after your treatment, we encourage you to take your nausea medication as directed.   If you develop nausea and vomiting that is not controlled by your nausea medication, call the clinic.   BELOW ARE SYMPTOMS THAT SHOULD BE REPORTED IMMEDIATELY:  *FEVER GREATER THAN 100.5 F  *CHILLS WITH OR WITHOUT FEVER  NAUSEA AND VOMITING THAT IS NOT CONTROLLED WITH YOUR NAUSEA MEDICATION  *UNUSUAL SHORTNESS OF BREATH  *UNUSUAL BRUISING OR BLEEDING  TENDERNESS IN MOUTH AND THROAT WITH OR WITHOUT PRESENCE OF ULCERS  *URINARY PROBLEMS  *BOWEL PROBLEMS  UNUSUAL RASH Items with * indicate a potential emergency and should be followed up as soon as possible.  Feel free to call the clinic should you have any questions or concerns. The clinic phone number is [phone].  Please show the [place] at check-in to the Emergency Department and triage nurse.  Docetaxel injection What is this medicine? DOCETAXEL (doe se TAX el) is a chemotherapy drug. It targets fast dividing cells, like cancer cells, and causes these cells to die. This medicine is used to treat many types of cancers like breast cancer, certain stomach cancers, head and neck cancer, lung cancer, and prostate cancer. This medicine may be used for other purposes; ask your health care provider or pharmacist if you have questions. COMMON BRAND NAME(S): Docefrez, Taxotere What should I tell my health care provider before I take this medicine? They need to know if you have any of these conditions:  infection (especially a virus infection such as chickenpox, cold sores, or herpes)  liver disease  low blood counts, like low white cell, platelet, or red cell counts  an unusual or allergic reaction to docetaxel, polysorbate 80, other  chemotherapy agents, other medicines, foods, dyes, or preservatives  pregnant or trying to get pregnant  breast-feeding How should I use this medicine? This drug is given as an infusion into a vein. It is administered in a hospital or clinic by a specially trained health care professional. Talk to your pediatrician regarding the use of this medicine in children. Special care may be needed. Overdosage: If you think you have taken too much of this medicine contact a poison control center or emergency room at once. NOTE: This medicine is only for you. Do not share this medicine with others. What if I miss a dose? It is important not to miss your dose. Call your doctor or health care professional if you are unable to keep an appointment. What may interact with this medicine?  aprepitant  certain antibiotics like erythromycin or clarithromycin  certain antivirals for HIV or hepatitis  certain medicines for fungal infections like fluconazole, itraconazole, ketoconazole, posaconazole, or voriconazole  cimetidine  ciprofloxacin  conivaptan  cyclosporine  dronedarone  fluvoxamine  grapefruit juice  imatinib  verapamil This list may not describe all possible interactions. Give your health care provider a list of all the medicines, herbs, non-prescription drugs, or dietary supplements you use. Also tell them if you smoke, drink alcohol, or use illegal drugs. Some items may interact with your medicine. What should I watch for while using this medicine? Your condition will be monitored carefully while you are receiving this medicine. You will need important blood work done while you are taking this medicine. Call your doctor or health care professional for advice if you get a fever,  chills or sore throat, or other symptoms of a cold or flu. Do not treat yourself. This drug decreases your body's ability to fight infections. Try to avoid being around people who are sick. Some products may  contain alcohol. Ask your health care professional if this medicine contains alcohol. Be sure to tell all health care professionals you are taking this medicine. Certain medicines, like metronidazole and disulfiram, can cause an unpleasant reaction when taken with alcohol. The reaction includes flushing, headache, nausea, vomiting, sweating, and increased thirst. The reaction can last from 30 minutes to several hours. You may get drowsy or dizzy. Do not drive, use machinery, or do anything that needs mental alertness until you know how this medicine affects you. Do not stand or sit up quickly, especially if you are an older patient. This reduces the risk of dizzy or fainting spells. Alcohol may interfere with the effect of this medicine. Talk to your health care professional about your risk of cancer. You may be more at risk for certain types of cancer if you take this medicine. Do not become pregnant while taking this medicine or for 6 months after stopping it. Women should inform their doctor if they wish to become pregnant or think they might be pregnant. There is a potential for serious side effects to an unborn child. Talk to your health care professional or pharmacist for more information. Do not breast-feed an infant while taking this medicine or for 1 to 2 weeks after stopping it. Males who get this medicine must use a condom during sex with females who can get pregnant. If you get a woman pregnant, the baby could have birth defects. The baby could die before they are born. You will need to continue wearing a condom for 3 months after stopping the medicine. Tell your health care provider right away if your partner becomes pregnant while you are taking this medicine. This may interfere with the ability to father a child. You should talk to your doctor or health care professional if you are concerned about your fertility. What side effects may I notice from receiving this medicine? Side effects that you  should report to your doctor or health care professional as soon as possible:  allergic reactions like skin rash, itching or hives, swelling of the face, lips, or tongue  blurred vision  breathing problems  changes in vision  low blood counts - This drug may decrease the number of white blood cells, red blood cells and platelets. You may be at increased risk for infections and bleeding.  nausea and vomiting  pain, redness or irritation at site where injected  pain, tingling, numbness in the hands or feet  redness, blistering, peeling, or loosening of the skin, including inside the mouth  signs of decreased platelets or bleeding - bruising, pinpoint red spots on the skin, black, tarry stools, nosebleeds  signs of decreased red blood cells - unusually weak or tired, fainting spells, lightheadedness  signs of infection - fever or chills, cough, sore throat, pain or difficulty passing urine  swelling of the ankle, feet, hands Side effects that usually do not require medical attention (report to your doctor or health care professional if they continue or are bothersome):  constipation  diarrhea  fingernail or toenail changes  hair loss  loss of appetite  mouth sores  muscle pain This list may not describe all possible side effects. Call your doctor for medical advice about side effects. You may report side effects to FDA  at 1-800-FDA-1088. Where should I keep my medicine? This drug is given in a hospital or clinic and will not be stored at home. NOTE: This sheet is a summary. It may not cover all possible information. If you have questions about this medicine, talk to your doctor, pharmacist, or health care provider.  2020 Elsevier/Gold Standard (2019-01-07 12:23:11)  Cyclophosphamide injection What is this medicine? CYCLOPHOSPHAMIDE (sye kloe FOSS fa mide) is a chemotherapy drug. It slows the growth of cancer cells. This medicine is used to treat many types of cancer  like lymphoma, myeloma, leukemia, breast cancer, and ovarian cancer, to name a few. This medicine may be used for other purposes; ask your health care provider or pharmacist if you have questions. COMMON BRAND NAME(S): Cytoxan, Neosar What should I tell my health care provider before I take this medicine? They need to know if you have any of these conditions:  blood disorders  history of other chemotherapy  infection  kidney disease  liver disease  recent or ongoing radiation therapy  tumors in the bone marrow  an unusual or allergic reaction to cyclophosphamide, other chemotherapy, other medicines, foods, dyes, or preservatives  pregnant or trying to get pregnant  breast-feeding How should I use this medicine? This drug is usually given as an injection into a vein or muscle or by infusion into a vein. It is administered in a hospital or clinic by a specially trained health care professional. Talk to your pediatrician regarding the use of this medicine in children. Special care may be needed. Overdosage: If you think you have taken too much of this medicine contact a poison control center or emergency room at once. NOTE: This medicine is only for you. Do not share this medicine with others. What if I miss a dose? It is important not to miss your dose. Call your doctor or health care professional if you are unable to keep an appointment. What may interact with this medicine? This medicine may interact with the following medications:  amiodarone  amphotericin B  azathioprine  certain antiviral medicines for HIV or AIDS such as protease inhibitors (e.g., indinavir, ritonavir) and zidovudine  certain blood pressure medications such as benazepril, captopril, enalapril, fosinopril, lisinopril, moexipril, monopril, perindopril, quinapril, ramipril, trandolapril  certain cancer medications such as anthracyclines (e.g., daunorubicin, doxorubicin), busulfan, cytarabine, paclitaxel,  pentostatin, tamoxifen, trastuzumab  certain diuretics such as chlorothiazide, chlorthalidone, hydrochlorothiazide, indapamide, metolazone  certain medicines that treat or prevent blood clots like warfarin  certain muscle relaxants such as succinylcholine  cyclosporine  etanercept  indomethacin  medicines to increase blood counts like filgrastim, pegfilgrastim, sargramostim  medicines used as general anesthesia  metronidazole  natalizumab This list may not describe all possible interactions. Give your health care provider a list of all the medicines, herbs, non-prescription drugs, or dietary supplements you use. Also tell them if you smoke, drink alcohol, or use illegal drugs. Some items may interact with your medicine. What should I watch for while using this medicine? Visit your doctor for checks on your progress. This drug may make you feel generally unwell. This is not uncommon, as chemotherapy can affect healthy cells as well as cancer cells. Report any side effects. Continue your course of treatment even though you feel ill unless your doctor tells you to stop. Drink water or other fluids as directed. Urinate often, even at night. In some cases, you may be given additional medicines to help with side effects. Follow all directions for their use. Call your doctor or  health care professional for advice if you get a fever, chills or sore throat, or other symptoms of a cold or flu. Do not treat yourself. This drug decreases your body's ability to fight infections. Try to avoid being around people who are sick. This medicine may increase your risk to bruise or bleed. Call your doctor or health care professional if you notice any unusual bleeding. Be careful brushing and flossing your teeth or using a toothpick because you may get an infection or bleed more easily. If you have any dental work done, tell your dentist you are receiving this medicine. You may get drowsy or dizzy. Do not  drive, use machinery, or do anything that needs mental alertness until you know how this medicine affects you. Do not become pregnant while taking this medicine or for 1 year after stopping it. Women should inform their doctor if they wish to become pregnant or think they might be pregnant. Men should not father a child while taking this medicine and for 4 months after stopping it. There is a potential for serious side effects to an unborn child. Talk to your health care professional or pharmacist for more information. Do not breast-feed an infant while taking this medicine. This medicine may interfere with the ability to have a child. This medicine has caused ovarian failure in some women. This medicine has caused reduced sperm counts in some men. You should talk with your doctor or health care professional if you are concerned about your fertility. If you are going to have surgery, tell your doctor or health care professional that you have taken this medicine. What side effects may I notice from receiving this medicine? Side effects that you should report to your doctor or health care professional as soon as possible:  allergic reactions like skin rash, itching or hives, swelling of the face, lips, or tongue  low blood counts - this medicine may decrease the number of white blood cells, red blood cells and platelets. You may be at increased risk for infections and bleeding.  signs of infection - fever or chills, cough, sore throat, pain or difficulty passing urine  signs of decreased platelets or bleeding - bruising, pinpoint red spots on the skin, black, tarry stools, blood in the urine  signs of decreased red blood cells - unusually weak or tired, fainting spells, lightheadedness  breathing problems  dark urine  dizziness  palpitations  swelling of the ankles, feet, hands  trouble passing urine or change in the amount of urine  weight gain  yellowing of the eyes or skin Side  effects that usually do not require medical attention (report to your doctor or health care professional if they continue or are bothersome):  changes in nail or skin color  hair loss  missed menstrual periods  mouth sores  nausea, vomiting This list may not describe all possible side effects. Call your doctor for medical advice about side effects. You may report side effects to FDA at 1-800-FDA-1088. Where should I keep my medicine? This drug is given in a hospital or clinic and will not be stored at home. NOTE: This sheet is a summary. It may not cover all possible information. If you have questions about this medicine, talk to your doctor, pharmacist, or health care provider.  2020 Elsevier/Gold Standard (2012-09-17 16:22:58)

## 2019-08-15 ENCOUNTER — Telehealth: Payer: Self-pay | Admitting: Hematology

## 2019-08-15 ENCOUNTER — Telehealth: Payer: Self-pay | Admitting: *Deleted

## 2019-08-15 ENCOUNTER — Inpatient Hospital Stay: Payer: BC Managed Care – PPO

## 2019-08-15 ENCOUNTER — Other Ambulatory Visit: Payer: Self-pay

## 2019-08-15 VITALS — BP 128/78 | HR 62 | Temp 98.2°F | Resp 18

## 2019-08-15 DIAGNOSIS — Z23 Encounter for immunization: Secondary | ICD-10-CM | POA: Diagnosis not present

## 2019-08-15 DIAGNOSIS — C50812 Malignant neoplasm of overlapping sites of left female breast: Secondary | ICD-10-CM | POA: Diagnosis not present

## 2019-08-15 DIAGNOSIS — I82442 Acute embolism and thrombosis of left tibial vein: Secondary | ICD-10-CM | POA: Diagnosis not present

## 2019-08-15 DIAGNOSIS — C773 Secondary and unspecified malignant neoplasm of axilla and upper limb lymph nodes: Secondary | ICD-10-CM | POA: Diagnosis not present

## 2019-08-15 DIAGNOSIS — Z803 Family history of malignant neoplasm of breast: Secondary | ICD-10-CM | POA: Diagnosis not present

## 2019-08-15 DIAGNOSIS — Z170 Estrogen receptor positive status [ER+]: Secondary | ICD-10-CM | POA: Diagnosis not present

## 2019-08-15 DIAGNOSIS — Z7901 Long term (current) use of anticoagulants: Secondary | ICD-10-CM | POA: Diagnosis not present

## 2019-08-15 DIAGNOSIS — Z5189 Encounter for other specified aftercare: Secondary | ICD-10-CM | POA: Diagnosis not present

## 2019-08-15 DIAGNOSIS — I82432 Acute embolism and thrombosis of left popliteal vein: Secondary | ICD-10-CM | POA: Diagnosis not present

## 2019-08-15 DIAGNOSIS — F418 Other specified anxiety disorders: Secondary | ICD-10-CM | POA: Diagnosis not present

## 2019-08-15 DIAGNOSIS — I82412 Acute embolism and thrombosis of left femoral vein: Secondary | ICD-10-CM | POA: Diagnosis not present

## 2019-08-15 DIAGNOSIS — Z5111 Encounter for antineoplastic chemotherapy: Secondary | ICD-10-CM | POA: Diagnosis not present

## 2019-08-15 MED ORDER — PEGFILGRASTIM-JMDB 6 MG/0.6ML ~~LOC~~ SOSY
PREFILLED_SYRINGE | SUBCUTANEOUS | Status: AC
  Filled 2019-08-15: qty 0.6

## 2019-08-15 MED ORDER — PEGFILGRASTIM-JMDB 6 MG/0.6ML ~~LOC~~ SOSY
6.0000 mg | PREFILLED_SYRINGE | Freq: Once | SUBCUTANEOUS | Status: AC
  Administered 2019-08-15: 12:00:00 6 mg via SUBCUTANEOUS

## 2019-08-15 NOTE — Patient Instructions (Signed)

## 2019-08-15 NOTE — Telephone Encounter (Signed)
Scheduled appt per 9/26 sch message - pt aware of appt date and time

## 2019-08-15 NOTE — Telephone Encounter (Signed)
-----   Message from Sinda Du, RN sent at 08/12/2019 10:57 AM EDT ----- Regarding: Dr. Burr Medico - 1st chemo f/u 1st chemo f/u

## 2019-08-15 NOTE — Telephone Encounter (Signed)
Called pt to see how she did with her Taxotere/CTX treatment on Fri.  She reports doing well & denies symptoms except not BM since Fri Am.Marland Kitchen Discussed increasing oral fluids, fiber, & stool softener/laxitives & encouraged to call back if nothing working.  She states she does not feel uncomfortable at this point.

## 2019-08-15 NOTE — Telephone Encounter (Signed)
Left message on voicemail for a return phone call to follow up after 1st chemo.

## 2019-08-15 NOTE — Telephone Encounter (Signed)
No los per 9/25 °

## 2019-08-15 NOTE — Telephone Encounter (Signed)
Pt came in for todays appt - per 9/26 sch message - msg sent to YF if follow up is still needed
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
## 2019-08-18 ENCOUNTER — Inpatient Hospital Stay: Payer: BC Managed Care – PPO | Attending: Hematology | Admitting: Hematology

## 2019-08-18 ENCOUNTER — Inpatient Hospital Stay (HOSPITAL_COMMUNITY)
Admission: EM | Admit: 2019-08-18 | Discharge: 2019-08-21 | DRG: 809 | Disposition: A | Discharge status: Home / Self Care | Payer: BC Managed Care – PPO | Attending: Family Medicine | Admitting: Family Medicine

## 2019-08-18 ENCOUNTER — Inpatient Hospital Stay: Payer: BC Managed Care – PPO

## 2019-08-18 ENCOUNTER — Encounter: Payer: Self-pay | Admitting: Hematology

## 2019-08-18 ENCOUNTER — Encounter (HOSPITAL_COMMUNITY): Payer: Self-pay

## 2019-08-18 ENCOUNTER — Telehealth: Payer: Self-pay | Admitting: Hematology

## 2019-08-18 ENCOUNTER — Other Ambulatory Visit: Payer: Self-pay

## 2019-08-18 VITALS — BP 104/82 | HR 120 | Resp 18 | Wt 147.8 lb

## 2019-08-18 DIAGNOSIS — N390 Urinary tract infection, site not specified: Secondary | ICD-10-CM

## 2019-08-18 DIAGNOSIS — N3000 Acute cystitis without hematuria: Secondary | ICD-10-CM | POA: Diagnosis not present

## 2019-08-18 DIAGNOSIS — Z170 Estrogen receptor positive status [ER+]: Secondary | ICD-10-CM | POA: Diagnosis not present

## 2019-08-18 DIAGNOSIS — D61810 Antineoplastic chemotherapy induced pancytopenia: Principal | ICD-10-CM | POA: Diagnosis present

## 2019-08-18 DIAGNOSIS — R5081 Fever presenting with conditions classified elsewhere: Secondary | ICD-10-CM | POA: Insufficient documentation

## 2019-08-18 DIAGNOSIS — Z9221 Personal history of antineoplastic chemotherapy: Secondary | ICD-10-CM

## 2019-08-18 DIAGNOSIS — Z803 Family history of malignant neoplasm of breast: Secondary | ICD-10-CM

## 2019-08-18 DIAGNOSIS — F32A Depression, unspecified: Secondary | ICD-10-CM | POA: Diagnosis present

## 2019-08-18 DIAGNOSIS — R000 Tachycardia, unspecified: Secondary | ICD-10-CM | POA: Diagnosis not present

## 2019-08-18 DIAGNOSIS — I824Y9 Acute embolism and thrombosis of unspecified deep veins of unspecified proximal lower extremity: Secondary | ICD-10-CM | POA: Diagnosis not present

## 2019-08-18 DIAGNOSIS — Z5189 Encounter for other specified aftercare: Secondary | ICD-10-CM | POA: Insufficient documentation

## 2019-08-18 DIAGNOSIS — R945 Abnormal results of liver function studies: Secondary | ICD-10-CM | POA: Diagnosis present

## 2019-08-18 DIAGNOSIS — K5900 Constipation, unspecified: Secondary | ICD-10-CM | POA: Insufficient documentation

## 2019-08-18 DIAGNOSIS — C50812 Malignant neoplasm of overlapping sites of left female breast: Secondary | ICD-10-CM | POA: Diagnosis present

## 2019-08-18 DIAGNOSIS — Z7901 Long term (current) use of anticoagulants: Secondary | ICD-10-CM

## 2019-08-18 DIAGNOSIS — R509 Fever, unspecified: Secondary | ICD-10-CM | POA: Diagnosis not present

## 2019-08-18 DIAGNOSIS — Z853 Personal history of malignant neoplasm of breast: Secondary | ICD-10-CM

## 2019-08-18 DIAGNOSIS — Z20828 Contact with and (suspected) exposure to other viral communicable diseases: Secondary | ICD-10-CM | POA: Diagnosis present

## 2019-08-18 DIAGNOSIS — I82409 Acute embolism and thrombosis of unspecified deep veins of unspecified lower extremity: Secondary | ICD-10-CM | POA: Diagnosis present

## 2019-08-18 DIAGNOSIS — Z86718 Personal history of other venous thrombosis and embolism: Secondary | ICD-10-CM

## 2019-08-18 DIAGNOSIS — Z5111 Encounter for antineoplastic chemotherapy: Secondary | ICD-10-CM | POA: Insufficient documentation

## 2019-08-18 DIAGNOSIS — Z9012 Acquired absence of left breast and nipple: Secondary | ICD-10-CM

## 2019-08-18 DIAGNOSIS — R42 Dizziness and giddiness: Secondary | ICD-10-CM | POA: Insufficient documentation

## 2019-08-18 DIAGNOSIS — R9431 Abnormal electrocardiogram [ECG] [EKG]: Secondary | ICD-10-CM | POA: Diagnosis not present

## 2019-08-18 DIAGNOSIS — Z79899 Other long term (current) drug therapy: Secondary | ICD-10-CM | POA: Insufficient documentation

## 2019-08-18 DIAGNOSIS — D849 Immunodeficiency, unspecified: Secondary | ICD-10-CM | POA: Diagnosis not present

## 2019-08-18 DIAGNOSIS — T451X5A Adverse effect of antineoplastic and immunosuppressive drugs, initial encounter: Secondary | ICD-10-CM | POA: Diagnosis present

## 2019-08-18 DIAGNOSIS — R5383 Other fatigue: Secondary | ICD-10-CM | POA: Insufficient documentation

## 2019-08-18 DIAGNOSIS — A419 Sepsis, unspecified organism: Secondary | ICD-10-CM | POA: Diagnosis not present

## 2019-08-18 DIAGNOSIS — D709 Neutropenia, unspecified: Secondary | ICD-10-CM | POA: Insufficient documentation

## 2019-08-18 DIAGNOSIS — F329 Major depressive disorder, single episode, unspecified: Secondary | ICD-10-CM | POA: Diagnosis present

## 2019-08-18 DIAGNOSIS — R519 Headache, unspecified: Secondary | ICD-10-CM

## 2019-08-18 DIAGNOSIS — R7989 Other specified abnormal findings of blood chemistry: Secondary | ICD-10-CM | POA: Diagnosis present

## 2019-08-18 DIAGNOSIS — R531 Weakness: Secondary | ICD-10-CM | POA: Insufficient documentation

## 2019-08-18 HISTORY — DX: Malignant (primary) neoplasm, unspecified: C80.1

## 2019-08-18 LAB — CBC WITH DIFFERENTIAL (CANCER CENTER ONLY)
Abs Immature Granulocytes: 0.24 10*3/uL — ABNORMAL HIGH (ref 0.00–0.07)
Basophils Absolute: 0 10*3/uL (ref 0.0–0.1)
Basophils Relative: 2 %
Eosinophils Absolute: 0.1 10*3/uL (ref 0.0–0.5)
Eosinophils Relative: 6 %
HCT: 39.1 % (ref 36.0–46.0)
Hemoglobin: 12.7 g/dL (ref 12.0–15.0)
Immature Granulocytes: 12 %
Lymphocytes Relative: 42 %
Lymphs Abs: 0.8 10*3/uL (ref 0.7–4.0)
MCH: 29.9 pg (ref 26.0–34.0)
MCHC: 32.5 g/dL (ref 30.0–36.0)
MCV: 92 fL (ref 80.0–100.0)
Monocytes Absolute: 0.1 10*3/uL (ref 0.1–1.0)
Monocytes Relative: 7 %
Neutro Abs: 0.6 10*3/uL — ABNORMAL LOW (ref 1.7–7.7)
Neutrophils Relative %: 31 %
Platelet Count: 190 10*3/uL (ref 150–400)
RBC: 4.25 MIL/uL (ref 3.87–5.11)
RDW: 12.9 % (ref 11.5–15.5)
WBC Count: 1.9 10*3/uL — ABNORMAL LOW (ref 4.0–10.5)
nRBC: 0 % (ref 0.0–0.2)

## 2019-08-18 LAB — URINALYSIS, COMPLETE (UACMP) WITH MICROSCOPIC
Glucose, UA: NEGATIVE mg/dL
Hgb urine dipstick: NEGATIVE
Ketones, ur: 5 mg/dL — AB
Nitrite: NEGATIVE
Protein, ur: 30 mg/dL — AB
Specific Gravity, Urine: 1.029 (ref 1.005–1.030)
WBC, UA: >50 WBC/hpf — ABNORMAL HIGH (ref 0–5)
pH: 5 (ref 5.0–8.0)

## 2019-08-18 LAB — CMP (CANCER CENTER ONLY)
ALT: 44 U/L (ref 0–44)
AST: 21 U/L (ref 15–41)
Albumin: 4.2 g/dL (ref 3.5–5.0)
Alkaline Phosphatase: 109 U/L (ref 38–126)
Anion gap: 9 (ref 5–15)
BUN: 15 mg/dL (ref 6–20)
CO2: 25 mmol/L (ref 22–32)
Calcium: 9.3 mg/dL (ref 8.9–10.3)
Chloride: 105 mmol/L (ref 98–111)
Creatinine: 0.96 mg/dL (ref 0.44–1.00)
GFR, Est AFR Am: >60 mL/min (ref >60)
GFR, Estimated: >60 mL/min (ref >60)
Glucose, Bld: 147 mg/dL — ABNORMAL HIGH (ref 70–99)
Potassium: 4.2 mmol/L (ref 3.5–5.1)
Sodium: 139 mmol/L (ref 135–145)
Total Bilirubin: 0.5 mg/dL (ref 0.3–1.2)
Total Protein: 7.2 g/dL (ref 6.5–8.1)

## 2019-08-18 MED ORDER — AMOXICILLIN-POT CLAVULANATE 875-125 MG PO TABS: 1.0000 | ORAL_TABLET | Freq: Two times a day (BID) | ORAL | 0 refills | Status: DC

## 2019-08-18 MED ORDER — SODIUM CHLORIDE 0.9 % IV SOLN
INTRAVENOUS | Status: DC
  Administered 2019-08-18: 14:00:00 via INTRAVENOUS
  Filled 2019-08-18 (×2): qty 250

## 2019-08-18 NOTE — Patient Instructions (Signed)
Dehydration, Adult  Dehydration is when there is not enough fluid or water in your body. This happens when you lose more fluids than you take in. Dehydration can range from mild to very bad. It should be treated right away to keep it from getting very bad. Symptoms of mild dehydration may include:  Thirst.  Dry lips.  Slightly dry mouth.  Dry, warm skin.  Dizziness. Symptoms of moderate dehydration may include:  Very dry mouth.  Muscle cramps.  Dark pee (urine). Pee may be the color of tea.  Your body making less pee.  Your eyes making fewer tears.  Heartbeat that is uneven or faster than normal (palpitations).  Headache.  Light-headedness, especially when you stand up from sitting.  Fainting (syncope). Symptoms of very bad dehydration may include:  Changes in skin, such as: ? Cold and clammy skin. ? Blotchy (mottled) or pale skin. ? Skin that does not quickly return to normal after being lightly pinched and let go (poor skin turgor).  Changes in body fluids, such as: ? Feeling very thirsty. ? Your eyes making fewer tears. ? Not sweating when body temperature is high, such as in hot weather. ? Your body making very little pee.  Changes in vital signs, such as: ? Weak pulse. ? Pulse that is more than 100 beats a minute when you are sitting still. ? Fast breathing. ? Low blood pressure.  Other changes, such as: ? Sunken eyes. ? Cold hands and feet. ? Confusion. ? Lack of energy (lethargy). ? Trouble waking up from sleep. ? Short-term weight loss. ? Unconsciousness. Follow these instructions at home:   If told by your doctor, drink an ORS: ? Make an ORS by using instructions on the package. ? Start by drinking small amounts, about  cup (120 mL) every 5-10 minutes. ? Slowly drink more until you have had the amount that your doctor said to have.  Drink enough clear fluid to keep your pee clear or pale yellow. If you were told to drink an ORS, finish the  ORS first, then start slowly drinking clear fluids. Drink fluids such as: ? Water. Do not drink only water by itself. Doing that can make the salt (sodium) level in your body get too low (hyponatremia). ? Ice chips. ? Fruit juice that you have added water to (diluted). ? Low-calorie sports drinks.  Avoid: ? Alcohol. ? Drinks that have a lot of sugar. These include high-calorie sports drinks, fruit juice that does not have water added, and soda. ? Caffeine. ? Foods that are greasy or have a lot of fat or sugar.  Take over-the-counter and prescription medicines only as told by your doctor.  Do not take salt tablets. Doing that can make the salt level in your body get too high (hypernatremia).  Eat foods that have minerals (electrolytes). Examples include bananas, oranges, potatoes, tomatoes, and spinach.  Keep all follow-up visits as told by your doctor. This is important. Contact a doctor if:  You have belly (abdominal) pain that: ? Gets worse. ? Stays in one area (localizes).  You have a rash.  You have a stiff neck.  You get angry or annoyed more easily than normal (irritability).  You are more sleepy than normal.  You have a harder time waking up than normal.  You feel: ? Weak. ? Dizzy. ? Very thirsty.  You have peed (urinated) only a small amount of very dark pee during 6-8 hours. Get help right away if:  You have   symptoms of very bad dehydration.  You cannot drink fluids without throwing up (vomiting).  Your symptoms get worse with treatment.  You have a fever.  You have a very bad headache.  You are throwing up or having watery poop (diarrhea) and it: ? Gets worse. ? Does not go away.  You have blood or something green (bile) in your throw-up.  You have blood in your poop (stool). This may cause poop to look black and tarry.  You have not peed in 6-8 hours.  You pass out (faint).  Your heart rate when you are sitting still is more than 100 beats a  minute.  You have trouble breathing. This information is not intended to replace advice given to you by your health care provider. Make sure you discuss any questions you have with your health care provider. Document Released: 08/30/2009 Document Revised: 10/16/2017 Document Reviewed: 12/28/2015 Elsevier Patient Education  2020 [employer]  

## 2019-08-18 NOTE — Telephone Encounter (Signed)
Pt called for fever again with T100.5 tonight. I recommend her to go to [facility] ED for evaluation, and likely needs to be admitted for neutropenic fever. She agrees with the plan. I informed ED charge nurse, she needs to be seen ASAP when she arrives, and start iv antibiotics promptly.   Truitt Merle  08/18/2019

## 2019-08-18 NOTE — ED Triage Notes (Signed)
Pt is being treated for breast cancer and had a fever last night, didn't feel well today, she received IV fluids at the [facility] this afternoon and then her fever spiked again tonight, she said it was 100.5 at home, she didn't take anything and came to the ER

## 2019-08-19 ENCOUNTER — Emergency Department (HOSPITAL_COMMUNITY): Payer: BC Managed Care – PPO

## 2019-08-19 ENCOUNTER — Telehealth: Payer: Self-pay | Admitting: Hematology

## 2019-08-19 ENCOUNTER — Other Ambulatory Visit: Payer: Self-pay

## 2019-08-19 DIAGNOSIS — R5081 Fever presenting with conditions classified elsewhere: Secondary | ICD-10-CM | POA: Diagnosis not present

## 2019-08-19 DIAGNOSIS — Z9012 Acquired absence of left breast and nipple: Secondary | ICD-10-CM | POA: Diagnosis not present

## 2019-08-19 DIAGNOSIS — D849 Immunodeficiency, unspecified: Secondary | ICD-10-CM | POA: Diagnosis not present

## 2019-08-19 DIAGNOSIS — R945 Abnormal results of liver function studies: Secondary | ICD-10-CM | POA: Diagnosis not present

## 2019-08-19 DIAGNOSIS — R509 Fever, unspecified: Secondary | ICD-10-CM | POA: Diagnosis present

## 2019-08-19 DIAGNOSIS — Z9221 Personal history of antineoplastic chemotherapy: Secondary | ICD-10-CM | POA: Diagnosis not present

## 2019-08-19 DIAGNOSIS — Z7901 Long term (current) use of anticoagulants: Secondary | ICD-10-CM | POA: Diagnosis not present

## 2019-08-19 DIAGNOSIS — N390 Urinary tract infection, site not specified: Secondary | ICD-10-CM

## 2019-08-19 DIAGNOSIS — F329 Major depressive disorder, single episode, unspecified: Secondary | ICD-10-CM | POA: Diagnosis present

## 2019-08-19 DIAGNOSIS — I82409 Acute embolism and thrombosis of unspecified deep veins of unspecified lower extremity: Secondary | ICD-10-CM | POA: Diagnosis present

## 2019-08-19 DIAGNOSIS — Z20828 Contact with and (suspected) exposure to other viral communicable diseases: Secondary | ICD-10-CM | POA: Diagnosis not present

## 2019-08-19 DIAGNOSIS — D709 Neutropenia, unspecified: Secondary | ICD-10-CM | POA: Diagnosis not present

## 2019-08-19 DIAGNOSIS — I824Y9 Acute embolism and thrombosis of unspecified deep veins of unspecified proximal lower extremity: Secondary | ICD-10-CM | POA: Diagnosis not present

## 2019-08-19 DIAGNOSIS — R7989 Other specified abnormal findings of blood chemistry: Secondary | ICD-10-CM | POA: Diagnosis present

## 2019-08-19 DIAGNOSIS — K5900 Constipation, unspecified: Secondary | ICD-10-CM | POA: Diagnosis not present

## 2019-08-19 DIAGNOSIS — C50812 Malignant neoplasm of overlapping sites of left female breast: Secondary | ICD-10-CM

## 2019-08-19 DIAGNOSIS — A419 Sepsis, unspecified organism: Secondary | ICD-10-CM

## 2019-08-19 DIAGNOSIS — N3000 Acute cystitis without hematuria: Secondary | ICD-10-CM

## 2019-08-19 DIAGNOSIS — Z86718 Personal history of other venous thrombosis and embolism: Secondary | ICD-10-CM | POA: Diagnosis not present

## 2019-08-19 DIAGNOSIS — F32A Depression, unspecified: Secondary | ICD-10-CM | POA: Diagnosis present

## 2019-08-19 DIAGNOSIS — Z853 Personal history of malignant neoplasm of breast: Secondary | ICD-10-CM | POA: Diagnosis not present

## 2019-08-19 DIAGNOSIS — Z803 Family history of malignant neoplasm of breast: Secondary | ICD-10-CM | POA: Diagnosis not present

## 2019-08-19 DIAGNOSIS — D61810 Antineoplastic chemotherapy induced pancytopenia: Secondary | ICD-10-CM | POA: Diagnosis not present

## 2019-08-19 DIAGNOSIS — T451X5A Adverse effect of antineoplastic and immunosuppressive drugs, initial encounter: Secondary | ICD-10-CM | POA: Diagnosis present

## 2019-08-19 LAB — CBC
HCT: 30.9 % — ABNORMAL LOW (ref 36.0–46.0)
Hemoglobin: 9.8 g/dL — ABNORMAL LOW (ref 12.0–15.0)
MCH: 30.1 pg (ref 26.0–34.0)
MCHC: 31.7 g/dL (ref 30.0–36.0)
MCV: 94.8 fL (ref 80.0–100.0)
Platelets: 134 10*3/uL — ABNORMAL LOW (ref 150–400)
RBC: 3.26 MIL/uL — ABNORMAL LOW (ref 3.87–5.11)
RDW: 12.8 % (ref 11.5–15.5)
WBC: 1.9 10*3/uL — ABNORMAL LOW (ref 4.0–10.5)
nRBC: 0 % (ref 0.0–0.2)

## 2019-08-19 LAB — CBC WITH DIFFERENTIAL/PLATELET
Abs Immature Granulocytes: 0.05 10*3/uL (ref 0.00–0.07)
Basophils Absolute: 0 10*3/uL (ref 0.0–0.1)
Basophils Relative: 2 %
Eosinophils Absolute: 0.1 10*3/uL (ref 0.0–0.5)
Eosinophils Relative: 5 %
HCT: 35.7 % — ABNORMAL LOW (ref 36.0–46.0)
Hemoglobin: 11.5 g/dL — ABNORMAL LOW (ref 12.0–15.0)
Immature Granulocytes: 4 %
Lymphocytes Relative: 59 %
Lymphs Abs: 0.8 10*3/uL (ref 0.7–4.0)
MCH: 29.9 pg (ref 26.0–34.0)
MCHC: 32.2 g/dL (ref 30.0–36.0)
MCV: 92.7 fL (ref 80.0–100.0)
Monocytes Absolute: 0.2 10*3/uL (ref 0.1–1.0)
Monocytes Relative: 18 %
Neutro Abs: 0.2 10*3/uL — ABNORMAL LOW (ref 1.7–7.7)
Neutrophils Relative %: 12 %
Platelets: 164 10*3/uL (ref 150–400)
RBC: 3.85 MIL/uL — ABNORMAL LOW (ref 3.87–5.11)
RDW: 12.8 % (ref 11.5–15.5)
WBC: 1.4 10*3/uL — CL (ref 4.0–10.5)
nRBC: 0 % (ref 0.0–0.2)

## 2019-08-19 LAB — HEPATITIS PANEL, ACUTE
HCV Ab: NONREACTIVE
Hep A IgM: NONREACTIVE
Hep B C IgM: NONREACTIVE
Hepatitis B Surface Ag: NONREACTIVE

## 2019-08-19 LAB — COMPREHENSIVE METABOLIC PANEL
ALT: 144 U/L — ABNORMAL HIGH (ref 0–44)
AST: 117 U/L — ABNORMAL HIGH (ref 15–41)
Albumin: 4.2 g/dL (ref 3.5–5.0)
Alkaline Phosphatase: 115 U/L (ref 38–126)
Anion gap: 11 (ref 5–15)
BUN: 15 mg/dL (ref 6–20)
CO2: 22 mmol/L (ref 22–32)
Calcium: 9 mg/dL (ref 8.9–10.3)
Chloride: 104 mmol/L (ref 98–111)
Creatinine, Ser: 0.84 mg/dL (ref 0.44–1.00)
GFR calc Af Amer: >60 mL/min (ref >60)
GFR calc non Af Amer: >60 mL/min (ref >60)
Glucose, Bld: 124 mg/dL — ABNORMAL HIGH (ref 70–99)
Potassium: 4.2 mmol/L (ref 3.5–5.1)
Sodium: 137 mmol/L (ref 135–145)
Total Bilirubin: 0.2 mg/dL — ABNORMAL LOW (ref 0.3–1.2)
Total Protein: 7 g/dL (ref 6.5–8.1)

## 2019-08-19 LAB — SARS CORONAVIRUS 2 BY RT PCR (HOSPITAL ORDER, PERFORMED IN ~~LOC~~ HOSPITAL LAB): SARS Coronavirus 2: NEGATIVE

## 2019-08-19 LAB — BASIC METABOLIC PANEL
Anion gap: 8 (ref 5–15)
BUN: 11 mg/dL (ref 6–20)
CO2: 21 mmol/L — ABNORMAL LOW (ref 22–32)
Calcium: 7.9 mg/dL — ABNORMAL LOW (ref 8.9–10.3)
Chloride: 111 mmol/L (ref 98–111)
Creatinine, Ser: 0.78 mg/dL (ref 0.44–1.00)
GFR calc Af Amer: >60 mL/min (ref >60)
GFR calc non Af Amer: >60 mL/min (ref >60)
Glucose, Bld: 97 mg/dL (ref 70–99)
Potassium: 4.2 mmol/L (ref 3.5–5.1)
Sodium: 140 mmol/L (ref 135–145)

## 2019-08-19 LAB — HIV ANTIBODY (ROUTINE TESTING W REFLEX): HIV Screen 4th Generation wRfx: NONREACTIVE

## 2019-08-19 LAB — PROCALCITONIN: Procalcitonin: <0.1 ng/mL

## 2019-08-19 LAB — LACTIC ACID, PLASMA
Lactic Acid, Venous: 2.6 mmol/L (ref 0.5–1.9)
Lactic Acid, Venous: 1.4 mmol/L (ref 0.5–1.9)

## 2019-08-19 LAB — URINE CULTURE: Culture: 50000 — AB

## 2019-08-19 LAB — PREGNANCY, URINE: Preg Test, Ur: NEGATIVE

## 2019-08-19 MED ORDER — VANCOMYCIN HCL IN DEXTROSE 750-5 MG/150ML-% IV SOLN: 750.0000 mg | Freq: Two times a day (BID) | INTRAVENOUS | Status: DC

## 2019-08-19 MED ORDER — ESCITALOPRAM OXALATE 20 MG PO TABS
20.0000 mg | ORAL_TABLET | Freq: Every day | ORAL | Status: DC
  Administered 2019-08-19 – 2019-08-20 (×2): 20 mg via ORAL
  Filled 2019-08-19: qty 1
  Filled 2019-08-19: qty 2
  Filled 2019-08-19: qty 1

## 2019-08-19 MED ORDER — SENNA 8.6 MG PO TABS: 1.0000 | ORAL_TABLET | Freq: Every day | ORAL | Status: DC | PRN

## 2019-08-19 MED ORDER — SODIUM CHLORIDE 0.9 % IV SOLN
2.0000 g | Freq: Once | INTRAVENOUS | Status: AC
  Administered 2019-08-19: 2 g via INTRAVENOUS
  Filled 2019-08-19: qty 2

## 2019-08-19 MED ORDER — APIXABAN 5 MG PO TABS
5.0000 mg | ORAL_TABLET | Freq: Two times a day (BID) | ORAL | Status: DC
  Administered 2019-08-19 – 2019-08-21 (×4): 5 mg via ORAL
  Filled 2019-08-19 (×5): qty 1

## 2019-08-19 MED ORDER — ONDANSETRON HCL 4 MG PO TABS
4.0000 mg | ORAL_TABLET | Freq: Four times a day (QID) | ORAL | Status: DC | PRN
  Filled 2019-08-19: qty 1

## 2019-08-19 MED ORDER — VANCOMYCIN HCL 10 G IV SOLR
1500.0000 mg | Freq: Once | INTRAVENOUS | Status: AC
  Administered 2019-08-19: 1500 mg via INTRAVENOUS
  Filled 2019-08-19: qty 1500

## 2019-08-19 MED ORDER — ONDANSETRON HCL 4 MG/2ML IJ SOLN
4.0000 mg | Freq: Four times a day (QID) | INTRAMUSCULAR | Status: DC | PRN
  Administered 2019-08-19: 4 mg via INTRAVENOUS
  Filled 2019-08-19: qty 2

## 2019-08-19 MED ORDER — SODIUM CHLORIDE 0.9 % IV SOLN
2.0000 g | Freq: Three times a day (TID) | INTRAVENOUS | Status: DC
  Administered 2019-08-19 – 2019-08-21 (×7): 2 g via INTRAVENOUS
  Filled 2019-08-19 (×8): qty 2

## 2019-08-19 MED ORDER — VANCOMYCIN HCL IN DEXTROSE 1-5 GM/200ML-% IV SOLN: 1000.0000 mg | Freq: Once | INTRAVENOUS | Status: DC

## 2019-08-19 MED ORDER — SODIUM CHLORIDE 0.9 % IV SOLN
INTRAVENOUS | Status: DC
  Administered 2019-08-19: 04:00:00 via INTRAVENOUS

## 2019-08-19 MED ORDER — SODIUM CHLORIDE 0.9 % IV BOLUS (SEPSIS)
1000.0000 mL | Freq: Once | INTRAVENOUS | Status: AC
  Administered 2019-08-19: 02:00:00 1000 mL via INTRAVENOUS

## 2019-08-19 MED ORDER — POLYETHYLENE GLYCOL 3350 17 G PO PACK
17.0000 g | PACK | Freq: Every day | ORAL | Status: DC
  Administered 2019-08-20 – 2019-08-21 (×2): 17 g via ORAL
  Filled 2019-08-19 (×2): qty 1

## 2019-08-19 MED ORDER — DIPHENHYDRAMINE HCL 25 MG PO CAPS
25.0000 mg | ORAL_CAPSULE | Freq: Once | ORAL | Status: AC
  Administered 2019-08-19: 25 mg via ORAL
  Filled 2019-08-19: qty 1

## 2019-08-19 MED ORDER — SODIUM CHLORIDE 0.9 % IV BOLUS (SEPSIS)
1000.0000 mL | Freq: Once | INTRAVENOUS | Status: AC
  Administered 2019-08-19: 1000 mL via INTRAVENOUS

## 2019-08-19 MED ORDER — ACETAMINOPHEN 325 MG PO TABS
325.0000 mg | ORAL_TABLET | Freq: Once | ORAL | Status: AC
  Administered 2019-08-19: 325 mg via ORAL
  Filled 2019-08-19: qty 1

## 2019-08-19 MED ORDER — ACETAMINOPHEN 500 MG PO TABS
1000.0000 mg | ORAL_TABLET | Freq: Once | ORAL | Status: AC
  Administered 2019-08-19: 1000 mg via ORAL
  Filled 2019-08-19: qty 2

## 2019-08-19 MED ORDER — BUPROPION HCL ER (XL) 150 MG PO TB24
150.0000 mg | ORAL_TABLET | Freq: Every day | ORAL | Status: DC
  Administered 2019-08-19 – 2019-08-21 (×3): 150 mg via ORAL
  Filled 2019-08-19 (×3): qty 1

## 2019-08-19 NOTE — Progress Notes (Signed)
A consult was received from an ED physician for cefepime and vancomycin per pharmacy dosing.  The patient's profile has been reviewed for ht/wt/allergies/indication/available labs.   A one time order has been placed for Cefepime 2 gm and Vancomyicin 1500 mg.  Further antibiotics/pharmacy consults should be ordered by admitting physician if indicated.                       Thank you, Dorrene German 08/19/2019  12:34 AM

## 2019-08-19 NOTE — ED Notes (Signed)
Pt husband brought home meds- Pt normally takes Eliquis in the AM. Since medication has not been verified yet by pharmacy, pt OK to take this home med.

## 2019-08-19 NOTE — Progress Notes (Signed)
Patient admitted to the hospital earlier this morning by Dr. Blaine Hamper  Patient seen and examined. She does not have any complaints at this time. She denies any cough, vomiting, diarrhea, dizziness or lightheadedness. No chest pain. Physical exam is unrevealing other than some mild tachycardia  A/P:  1. Sepsis with neutropenic fever.  She did not have a clear source of infection, although urinary tract infection is suspected.  Urine culture as well as blood cultures in process.  Chest x-ray negative.  COVID-19 negative.  She does not have any Port-A-Cath or any other central lines.  Currently on cefepime.  Will discontinue vancomycin.  Currently on cefepime, will discontinue vancomycin.  Appreciate oncology input.  Continue to monitor for any developing signs of infection.  Overall lactic acid has trended down with IV fluids.  Hemodynamics appear to be stabilizing. 2. Breast cancer.  Continue follow-up with oncology.  Last chemotherapy was 1 week ago. 3. Pancytopenia.  Secondary to chemotherapy.  Continue to monitor. 4. Depression.  Continue on Wellbutrin and Lexapro. 5. History of DVT.  Continue on Eliquis.  Monitor platelets with mild thrombocytopenia. 6. Abnormal liver enzymes.  Suspect this is related to Lyme depletion dehydration.  Hepatitis panel and HIV in process.  She does not have any abdominal pain, nausea or vomiting.  [employer]

## 2019-08-19 NOTE — ED Provider Notes (Signed)
TIME SEEN: 12:11 AM  CHIEF COMPLAINT: Neutropenic fever  HPI: Patient is a 53 year old female with history of breast cancer with last chemotherapy on 08/12/2019, DVT on Eliquis who presents to the emergency department fever that started yesterday.  Went to the cancer center and had blood work drawn, urine checked and given IV fluids.  Was found to be neutropenic.  Fever returned tonight was 100.5 and was instructed to come to the emergency department by her oncologist Dr. Burr Medico.  Patient denies cough, chest pain, shortness of breath, headache, neck pain or neck stiffness, abdominal pain, vomiting, diarrhea, dysuria, hematuria, rash.  Has been experiencing some lower back pain here in the emergency department but no flank pain.  Was instructed to come here for IV antibiotics and admission.  ROS: See HPI Constitutional:  fever  Eyes: no drainage  ENT: no runny nose   Cardiovascular:  no chest pain  Resp: no SOB  GI: no vomiting GU: no dysuria Integumentary: no rash  Allergy: no hives  Musculoskeletal: no leg swelling  Neurological: no slurred speech ROS otherwise negative  PAST MEDICAL HISTORY/PAST SURGICAL HISTORY:  Past Medical History:  Diagnosis Date  . Cancer [facility])     MEDICATIONS:  Prior to Admission medications   Medication Sig Start Date End Date Taking? Authorizing Provider  amoxicillin-clavulanate (AUGMENTIN) 875-125 MG tablet Take 1 tablet by mouth 2 (two) times daily. 08/18/19   Truitt Merle, MD  apixaban (ELIQUIS) 5 MG TABS tablet Take 5 mg by mouth 2 (two) times daily.    [provider]  buPROPion (WELLBUTRIN XL) 150 MG 24 hr tablet Take 150 mg by mouth daily.    [provider]  dexamethasone (DECADRON) 4 MG tablet Take 1 tablet (4 mg total) by mouth 2 (two) times daily with a meal. 1 tablet (4mg ) 2 times, morning and afternoon,  the day before chemo; 1 tablet daily x 3 days starting the day after chemo 08/09/19   Truitt Merle, MD  escitalopram (LEXAPRO) 20 MG  tablet Take 20 mg by mouth daily.    [provider]  ondansetron (ZOFRAN) 8 MG tablet Take 1 tablet (8 mg total) by mouth 2 (two) times daily. 08/09/19   Truitt Merle, MD  prochlorperazine (COMPAZINE) 10 MG tablet Take 1 tablet (10 mg total) by mouth every 6 (six) hours as needed for nausea or vomiting. 08/09/19   Truitt Merle, MD    ALLERGIES:  No Known Allergies  SOCIAL HISTORY:  Social History   Tobacco Use  . Smoking status: Never Smoker  . Smokeless tobacco: Never Used  Substance Use Topics  . Alcohol use: Never    Frequency: Never    FAMILY HISTORY: Family History  Problem Relation Age of Onset  . Cancer Other        breast cancer in GM's sister  . Cancer Other        breast cancer in great GM     EXAM: BP 112/72 (BP Location: Right Arm)   Pulse (!) 108   Temp 100.1 F (37.8 C) (Oral)   Resp 16   SpO2 100%  CONSTITUTIONAL: Alert and oriented and responds appropriately to questions. Well-appearing; well-nourished nontoxic appearing, well-hydrated HEAD: Normocephalic EYES: Conjunctivae clear, pupils appear equal, EOMI ENT: normal nose; moist mucous membranes NECK: Supple, no meningismus, no nuchal rigidity, no LAD  CARD: Regular and minimally tachycardic; S1 and S2 appreciated; no murmurs, no clicks, no rubs, no gallops RESP: Normal chest excursion without splinting or tachypnea; breath sounds  clear and equal bilaterally; no wheezes, no rhonchi, no rales, no hypoxia or respiratory distress, speaking full sentences ABD/GI: Normal bowel sounds; non-distended; soft, non-tender, no rebound, no guarding, no peritoneal signs, no hepatosplenomegaly BACK:  The back appears normal and is non-tender to palpation, there is no CVA tenderness EXT: Normal ROM in all joints; non-tender to palpation; no edema; normal capillary refill; no cyanosis, no calf tenderness or swelling    SKIN: Normal color for age and race; warm; no rash NEURO: Moves all extremities equally, normal  speech, no facial asymmetry, normal gait PSYCH: The patient's mood and manner are appropriate. Grooming and personal hygiene are appropriate.  MEDICAL DECISION MAKING: Patient here with neutropenic fever.  She is extremely well-appearing on exam.  Labs were obtained yesterday at the cancer center.  She did appear to have a UTI.  Urine culture and blood culture is already pending.  Will repeat basic labs including lactate today.  Will obtain COVID swab and chest x-ray.  Will give broad antibiotics and IV fluids.  Will admit patient.  ED PROGRESS: Lactate elevated.  Getting IVF.  CXR clear.  COVID pending.   2:08 AM Discussed patient's case with hospitalist, Dr. Blaine Kent.  I have recommended admission and patient (and family if present) agree with this plan. Admitting physician will place admission orders.   I reviewed all nursing notes, vitals, pertinent previous records, EKGs, lab and urine results, imaging (as available).      EKG Interpretation  Date/Time:  Friday August 19 2019 01:02:29 EDT Ventricular Rate:  110 PR Interval:    QRS Duration: 85 QT Interval:  318 QTC Calculation: 431 R Axis:   72 Text Interpretation:  Sinus tachycardia Borderline T wave abnormalities No old tracing to compare Confirmed by Megan Kent, Megan Kent [id number]) on 08/19/2019 1:06:49 AM        CRITICAL CARE Performed by: Megan Kent Megan Kent   Total critical care time: 55 minutes  Critical care time was exclusive of separately billable procedures and treating other patients.  Critical care was necessary to treat or prevent imminent or life-threatening deterioration.  Critical care was time spent personally by me on the following activities: development of treatment plan with patient and/or surrogate as well as nursing, discussions with consultants, evaluation of patient's response to treatment, examination of patient, obtaining history from patient or surrogate, ordering and performing treatments and interventions,  ordering and review of laboratory studies, ordering and review of radiographic studies, pulse oximetry and re-evaluation of patient's condition.   Megan Kent was evaluated in Emergency Department on 08/19/2019 for the symptoms described in the history of present illness. She was evaluated in the context of the global COVID-19 pandemic, which necessitated consideration that the patient might be at risk for infection with the SARS-CoV-2 virus that causes COVID-19. Institutional protocols and algorithms that pertain to the evaluation of patients at risk for COVID-19 are in a state of rapid change based on information released by regulatory bodies including the CDC and federal and state organizations. These policies and algorithms were followed during the patient's care in the ED.    Megan Kent, Delice Bison, DO 08/19/19 [phone]

## 2019-08-19 NOTE — ED Notes (Signed)
Pt asked for a snack, crackers and ginger ale given

## 2019-08-19 NOTE — ED Notes (Addendum)
CRITICAL VALUE STICKER  CRITICAL VALUE: WBC 1.4  RECEIVER (on-site recipient of call): Maylon Cos T RN  DATE & TIME NOTIFIED: 08/19/19 0144  MESSENGER (representative from lab): Alexis  RESPONSE: Pt being admitted

## 2019-08-19 NOTE — Progress Notes (Addendum)
Megan Kent   DOB:1966-10-03   CE#:[id number]   ESL#:[id number]  Oncology follow up   Subjective: Pt is still in ED, awaiting for a hospital bed.  She did not sleep last night, was quite distressed, had a mild nausea, no vomiting.  Vital signs are stable, except for tachycardia, no pain or other new complaints.   Objective:  Vitals:   08/19/19 0630 08/19/19 0700  BP: 105/71 107/61  Pulse: (!) 105 100  Resp: 15 12  Temp:    SpO2: 96% 96%    There is no height or weight on file to calculate BMI. No intake or output data in the 24 hours ending 08/19/19 0802   Sclerae unicteric, no skin rashes   Oropharynx clear  No peripheral adenopathy  Lungs clear -- no rales or rhonchi  Heart regular rate and rhythm  Abdomen benign  MSK no focal spinal tenderness, no peripheral edema  Neuro nonfocal    CBG (last 3)  No results for input(s): GLUCAP in the last 72 hours.   Labs:  Urine Studies No results for input(s): UHGB, CRYS in the last 72 hours.  Invalid input(s): UACOL, UAPR, USPG, UPH, UTP, UGL, UKET, UBIL, UNIT, UROB, ULEU, UEPI, UWBC, URBC, UBAC, CAST, [place], [place]  Basic Metabolic Panel: Recent Labs  Lab 08/18/19 1155 08/19/19 0023 08/19/19 0609  NA 139 137 140  K 4.2 4.2 4.2  CL 105 104 111  CO2 25 22 21*  GLUCOSE 147* 124* 97  BUN 15 15 11   CREATININE 0.96 0.84 0.78  CALCIUM 9.3 9.0 7.9*   GFR Estimated Creatinine Clearance: 78.8 mL/min (by C-G formula based on SCr of 0.78 mg/dL). Liver Function Tests: Recent Labs  Lab 08/18/19 1155 08/19/19 0023  AST 21 117*  ALT 44 144*  ALKPHOS 109 115  BILITOT 0.5 0.2*  PROT 7.2 7.0  ALBUMIN 4.2 4.2   No results for input(s): LIPASE, AMYLASE in the last 168 hours. No results for input(s): AMMONIA in the last 168 hours. Coagulation profile No results for input(s): INR, PROTIME in the last 168 hours.  CBC: Recent Labs  Lab 08/18/19 1155 08/19/19 0023 08/19/19 0609  WBC 1.9* 1.4* 1.9*  NEUTROABS 0.6* 0.2*  --    HGB 12.7 11.5* 9.8*  HCT 39.1 35.7* 30.9*  MCV 92.0 92.7 94.8  PLT 190 164 134*   Cardiac Enzymes: No results for input(s): CKTOTAL, CKMB, CKMBINDEX, TROPONINI in the last 168 hours. BNP: Invalid input(s): POCBNP CBG: No results for input(s): GLUCAP in the last 168 hours. D-Dimer No results for input(s): DDIMER in the last 72 hours. Hgb A1c No results for input(s): HGBA1C in the last 72 hours. Lipid Profile No results for input(s): CHOL, HDL, LDLCALC, TRIG, CHOLHDL, LDLDIRECT in the last 72 hours. Thyroid function studies No results for input(s): TSH, T4TOTAL, T3FREE, THYROIDAB in the last 72 hours.  Invalid input(s): FREET3 Anemia work up No results for input(s): VITAMINB12, FOLATE, FERRITIN, TIBC, IRON, RETICCTPCT in the last 72 hours. Microbiology Recent Results (from the past 240 hour(s))  Culture, Blood     Status: None (Preliminary result)   Collection Time: 08/18/19 12:55 PM   Specimen: BLOOD  Result Value Ref Range Status   Specimen Description BLOOD SITE NOT SPECIFIED  Final   Special Requests   Final    BOTTLES DRAWN AEROBIC AND ANAEROBIC Blood Culture adequate volume   Culture   Final    NO GROWTH < 24 HOURS Performed at [facility], [address] [place], [place] [address]    Report Status PENDING  Incomplete  Culture, Blood     Status: None (Preliminary result)   Collection Time: 08/18/19 12:59 PM   Specimen: BLOOD  Result Value Ref Range Status   Specimen Description   Final    BLOOD Performed at [facility], [place] [address] [place], [place] [address]    Special Requests   Final    NONE Performed at [facility], [place] [address], [place], [place] [address]    Culture   Final    NO GROWTH < 24 HOURS Performed at [place] [address], [place], [place] [address]    Report Status PENDING  Incomplete  SARS Coronavirus 2 [facility] order, Performed in [facility] hospital lab)  Nasopharyngeal Nasopharyngeal Swab     Status: None   Collection Time: 08/19/19 12:29 AM   Specimen: Nasopharyngeal Swab  Result Value Ref Range Status   SARS Coronavirus 2 NEGATIVE NEGATIVE Final    Comment: (NOTE) If result is NEGATIVE SARS-CoV-2 target nucleic acids are NOT DETECTED. The SARS-CoV-2 RNA is generally detectable in upper and lower  respiratory specimens during the acute phase of infection. The lowest  concentration of SARS-CoV-2 viral copies this assay can detect is 250  copies / mL. A negative result does not preclude SARS-CoV-2 infection  and should not be used as the sole basis for treatment or other  patient management decisions.  A negative result may occur with  improper specimen collection / handling, submission of specimen other  than nasopharyngeal swab, presence of viral mutation(s) within the  areas targeted by this assay, and inadequate number of viral copies  (<250 copies / mL). A negative result must be combined with clinical  observations, patient history, and epidemiological information. If result is POSITIVE SARS-CoV-2 target nucleic acids are DETECTED. The SARS-CoV-2 RNA is generally detectable in upper and lower  respiratory specimens dur ing the acute phase of infection.  Positive  results are indicative of active infection with SARS-CoV-2.  Clinical  correlation with patient history and other diagnostic information is  necessary to determine patient infection status.  Positive results do  not rule out bacterial infection or co-infection with other viruses. If result is PRESUMPTIVE POSTIVE SARS-CoV-2 nucleic acids MAY BE PRESENT.   A presumptive positive result was obtained on the submitted specimen  and confirmed on repeat testing.  While 2019 novel coronavirus  (SARS-CoV-2) nucleic acids may be present in the submitted sample  additional confirmatory testing may be necessary for epidemiological  and / or clinical management purposes  to  differentiate between  SARS-CoV-2 and other Sarbecovirus currently known to infect humans.  If clinically indicated additional testing with an alternate test  methodology [phone]) is advised. The SARS-CoV-2 RNA is generally  detectable in upper and lower respiratory sp ecimens during the acute  phase of infection. The expected result is Negative. Fact Sheet for Patients:  StrictlyIdeas.no Fact Sheet for Healthcare Providers: BankingDealers.co.za This test is not yet approved or cleared by the [place] FDA and has been authorized for detection and/or diagnosis of SARS-CoV-2 by FDA under an Emergency Use Authorization (EUA).  This EUA will remain in effect (meaning this test can be used) for the duration of the COVID-19 declaration under Section 564(b)(1) of the Act, 21 U.S.C. section 360bbb-3(b)(1), unless the authorization is terminated or revoked sooner. Performed at [facility], [place] [address] [place],  [address]  Studies:  Dg Chest Port 1 View  Result Date: 08/19/2019 CLINICAL DATA:  54 year old female with neutropenic fever. Undergoing chemotherapy for breast cancer. EXAM: PORTABLE CHEST 1 VIEW COMPARISON:  None. FINDINGS: Portable AP semi upright view at 0021 hours. Postoperative changes to the left chest wall with tissue expander in place. Normal lung volumes and mediastinal contours. Allowing for portable technique the lungs are clear. Visualized tracheal air column is within normal limits. Chronic mid left clavicle fracture. No acute osseous abnormality identified. Paucity of bowel gas in the visible upper abdomen. IMPRESSION: No cardiopulmonary abnormality. Electronically Signed   By: Genevie Ann M.D.   On: 08/19/2019 01:11    Assessment: 53 y.o. female with stage IB breast cancer, s/p mastectomy, on adjuvant chemo, presented with neutropenic fever  1.  Neutropenic fever 2. ? UTI  3.   Transaminitis, possibly related to chemotherapy or infection 4. Stage IB left breast cancer, ER and PR positive, HER-2 negative, status post mastectomy, started adjuvant chemotherapy 1 week ago 5. Constipation, secondary to chemo  6. Depression     Plan:  -lab reviewed, [place] 0.2K last night, she received Udenyca (GCSF) on 9/28 -cultures are pending, UA positive, but she is asymptomatic, culture still pending -She does not have a port, or any skin infection, I think she may not need vancomycin. Continue cefepime -I called [facility], hope she will go there soon  -I called her husband and updated him  -if neutropenia improves, cultures negative for 48h, and afebrile for more than 24 hours, OK to discharge home in a few days  -I appreciate the hoospitalist team's care    Truitt Merle, MD 08/19/2019  8:02 AM

## 2019-08-19 NOTE — ED Notes (Signed)
Pt ambulatory to restroom

## 2019-08-19 NOTE — Telephone Encounter (Signed)
No los per 10/1

## 2019-08-19 NOTE — Progress Notes (Signed)
Pharmacy Antibiotic Note  Megan Kent is a 53 y.o. female admitted on 08/18/2019 with neurtropenic fever.  Pharmacy has been consulted for cefepime and vanomycin dosing.  Plan: Cefepime 2 Gm IV q8h Vancomycin 1500 mg x1 then 750 mg IV q12h for est AUC = 466 Goal AUC = 400-550 F/u scr/cultures/levels     Temp (24hrs), Avg:99.4 F (37.4 C), Min:98.6 F (37 C), Max:100.1 F (37.8 C)  Recent Labs  Lab 08/18/19 1155 08/19/19 0023 08/19/19 0024  WBC 1.9* 1.4*  --   CREATININE 0.96 0.84  --   LATICACIDVEN  --   --  2.6*    Estimated Creatinine Clearance: 75 mL/min (by C-G formula based on SCr of 0.84 mg/dL).    No Known Allergies  Antimicrobials this admission: 10/2 cefepime >>  10/2 vanomycin >>   Dose adjustments this admission:   Microbiology results:  BCx:   UCx:    Sputum:    MRSA PCR:   Thank you for allowing pharmacy to be a part of this patient's care.  Dorrene German 08/19/2019 2:40 AM

## 2019-08-19 NOTE — H&P (Signed)
History and Physical    Megan Kent [id number] DOB: [birth year] DOA: 08/18/2019  Referring MD/NP/PA:   PCP: Hulan Fess, MD   Patient coming from:  The patient is coming from home.  At baseline, pt is independent for most of ADL.        Chief Complaint: Fever  HPI: Megan Kent is a 53 y.o. female with medical history significant of breast cancer on chemotherapy, DVT on Eliquis, depression, who presents with fever.  Patient states that she developed fever of 100.5 yesterday.  Her oncologist, Dr. Burr Medico put her on Augmentin, recommended patient come to the hospital.  Patient denies cough, shortness of breath or chest pain.  She has mild nausea, but no vomiting, diarrhea or abdominal pain.  Patient is mildly constipated.  No symptoms of UTI or unilateral weakness.  ED Course: pt was found to have WBC 1.9, positive urinalysis but with squamous cell contamination (hazy appearance, large amount of leukocyte, rare bacteria, WBC> 50, squamous cell 11-20), abnormal liver function (ALP 111, AST 117, ALT 144, total bilirubin 0.2), negative COVID-19 test, electrolytes renal function okay, temperature 100.4, oxygen saturation 96% on room air, RR 22, tachycardia, blood pressure 116/76, negative chest x-ray.  Patient is admitted to telemetry bed as inpatient.  Review of Systems:   General: has fevers, chills, no body weight gain, has fatigue HEENT: no blurry vision, hearing changes or sore throat Respiratory: no dyspnea, coughing, wheezing CV: no chest pain, no palpitations GI: has nausea, no vomiting, abdominal pain, diarrhea, constipation GU: no dysuria, burning on urination, increased urinary frequency, hematuria  Ext: no leg edema Neuro: no unilateral weakness, numbness, or tingling, no vision change or hearing loss Skin: no rash, no skin tear. MSK: No muscle spasm, no deformity, no limitation of range of movement in spin Heme: No easy bruising.  Travel history: No recent long distant  travel.  Allergy: No Known Allergies  Past Medical History:  Diagnosis Date  . Cancer [facility])     History reviewed. No pertinent surgical history.  Social History:  reports that she has never smoked. She has never used smokeless tobacco. She reports that she does not drink alcohol or use drugs.  Family History:  Family History  Problem Relation Age of Onset  . Cancer Other        breast cancer in GM's sister  . Cancer Other        breast cancer in great GM      Prior to Admission medications   Medication Sig Start Date End Date Taking? Authorizing Provider  amoxicillin-clavulanate (AUGMENTIN) 875-125 MG tablet Take 1 tablet by mouth 2 (two) times daily. 08/18/19  Yes Truitt Merle, MD  apixaban (ELIQUIS) 5 MG TABS tablet Take 5 mg by mouth 2 (two) times daily.   Yes [provider]  buPROPion (WELLBUTRIN XL) 150 MG 24 hr tablet Take 150 mg by mouth daily.   Yes [provider]  dexamethasone (DECADRON) 4 MG tablet Take 1 tablet (4 mg total) by mouth 2 (two) times daily with a meal. 1 tablet (4mg ) 2 times, morning and afternoon,  the day before chemo; 1 tablet daily x 3 days starting the day after chemo 08/09/19  Yes Truitt Merle, MD  escitalopram (LEXAPRO) 20 MG tablet Take 20 mg by mouth daily.   Yes [provider]  ondansetron (ZOFRAN) 8 MG tablet Take 1 tablet (8 mg total) by mouth 2 (two) times daily. 08/09/19  Yes Truitt Merle, MD  prochlorperazine (COMPAZINE) 10 MG  tablet Take 1 tablet (10 mg total) by mouth every 6 (six) hours as needed for nausea or vomiting. 08/09/19  Yes Truitt Merle, MD    Physical Exam: Vitals:   08/19/19 0300 08/19/19 0330 08/19/19 0400 08/19/19 0500  BP: 100/63 106/62 102/64 107/61  Pulse: (!) 109 (!) 105 (!) 102 (!) 109  Resp: (!) 7 (!) 9 12 17   Temp:      TempSrc:      SpO2: 98% 96% 97% 95%   General: Not in acute distress HEENT:       Eyes: PERRL, EOMI, no scleral icterus.       ENT: No discharge from the ears and nose, no  pharynx injection, no tonsillar enlargement.        Neck: No JVD, no bruit, no mass felt. Heme: No neck lymph node enlargement. Cardiac: S1/S2, RRR, No murmurs, No gallops or rubs. Respiratory:  No rales, wheezing, rhonchi or rubs. GI: Soft, nondistended, nontender, no rebound pain, no organomegaly, BS present. GU: No hematuria Ext: No pitting leg edema bilaterally. 2+DP/PT pulse bilaterally. Musculoskeletal: No joint deformities, No joint redness or warmth, no limitation of ROM in spin. Skin: No rashes.  Neuro: Alert, oriented X3, cranial nerves II-XII grossly intact, moves all extremities normally. Psych: Patient is not psychotic, no suicidal or hemocidal ideation.  Labs on Admission: I have personally reviewed following labs and imaging studies  CBC: Recent Labs  Lab 08/18/19 1155 08/19/19 0023  WBC 1.9* 1.4*  NEUTROABS 0.6* 0.2*  HGB 12.7 11.5*  HCT 39.1 35.7*  MCV 92.0 92.7  PLT 190 [id number]   Basic Metabolic Panel: Recent Labs  Lab 08/18/19 1155 08/19/19 0023  NA 139 137  K 4.2 4.2  CL 105 104  CO2 25 22  GLUCOSE 147* 124*  BUN 15 15  CREATININE 0.96 0.84  CALCIUM 9.3 9.0   GFR: Estimated Creatinine Clearance: 75 mL/min (by C-G formula based on SCr of 0.84 mg/dL). Liver Function Tests: Recent Labs  Lab 08/18/19 1155 08/19/19 0023  AST 21 117*  ALT 44 144*  ALKPHOS 109 115  BILITOT 0.5 0.2*  PROT 7.2 7.0  ALBUMIN 4.2 4.2   No results for input(s): LIPASE, AMYLASE in the last 168 hours. No results for input(s): AMMONIA in the last 168 hours. Coagulation Profile: No results for input(s): INR, PROTIME in the last 168 hours. Cardiac Enzymes: No results for input(s): CKTOTAL, CKMB, CKMBINDEX, TROPONINI in the last 168 hours. BNP (last 3 results) No results for input(s): PROBNP in the last 8760 hours. HbA1C: No results for input(s): HGBA1C in the last 72 hours. CBG: No results for input(s): GLUCAP in the last 168 hours. Lipid Profile: No results for  input(s): CHOL, HDL, LDLCALC, TRIG, CHOLHDL, LDLDIRECT in the last 72 hours. Thyroid Function Tests: No results for input(s): TSH, T4TOTAL, FREET4, T3FREE, THYROIDAB in the last 72 hours. Anemia Panel: No results for input(s): VITAMINB12, FOLATE, FERRITIN, TIBC, IRON, RETICCTPCT in the last 72 hours. Urine analysis:    Component Value Date/Time   COLORURINE AMBER (A) 08/18/2019 1256   APPEARANCEUR HAZY (A) 08/18/2019 1256   LABSPEC 1.029 08/18/2019 1256   PHURINE 5.0 08/18/2019 1256   GLUCOSEU NEGATIVE 08/18/2019 1256   HGBUR NEGATIVE 08/18/2019 1256   BILIRUBINUR SMALL (A) 08/18/2019 1256   KETONESUR 5 (A) 08/18/2019 1256   PROTEINUR 30 (A) 08/18/2019 1256   NITRITE NEGATIVE 08/18/2019 1256   LEUKOCYTESUR LARGE (A) 08/18/2019 1256   Sepsis Labs: @LABRCNTIP (procalcitonin:4,lacticidven:4) ) Recent Results (from the  past 240 hour(s))  Culture, Blood     Status: None (Preliminary result)   Collection Time: 08/18/19 12:55 PM   Specimen: BLOOD  Result Value Ref Range Status   Specimen Description BLOOD SITE NOT SPECIFIED  Final   Special Requests   Final    BOTTLES DRAWN AEROBIC AND ANAEROBIC Blood Culture adequate volume Performed at [facility], [address], [place], [place] [address]    Culture PENDING  Incomplete   Report Status PENDING  Incomplete  SARS Coronavirus 2 [facility] order, Performed in [facility] hospital lab) Nasopharyngeal Nasopharyngeal Swab     Status: None   Collection Time: 08/19/19 12:29 AM   Specimen: Nasopharyngeal Swab  Result Value Ref Range Status   SARS Coronavirus 2 NEGATIVE NEGATIVE Final    Comment: (NOTE) If result is NEGATIVE SARS-CoV-2 target nucleic acids are NOT DETECTED. The SARS-CoV-2 RNA is generally detectable in upper and lower  respiratory specimens during the acute phase of infection. The lowest  concentration of SARS-CoV-2 viral copies this assay can detect is 250  copies / mL. A negative result does not preclude  SARS-CoV-2 infection  and should not be used as the sole basis for treatment or other  patient management decisions.  A negative result may occur with  improper specimen collection / handling, submission of specimen other  than nasopharyngeal swab, presence of viral mutation(s) within the  areas targeted by this assay, and inadequate number of viral copies  (<250 copies / mL). A negative result must be combined with clinical  observations, patient history, and epidemiological information. If result is POSITIVE SARS-CoV-2 target nucleic acids are DETECTED. The SARS-CoV-2 RNA is generally detectable in upper and lower  respiratory specimens dur ing the acute phase of infection.  Positive  results are indicative of active infection with SARS-CoV-2.  Clinical  correlation with patient history and other diagnostic information is  necessary to determine patient infection status.  Positive results do  not rule out bacterial infection or co-infection with other viruses. If result is PRESUMPTIVE POSTIVE SARS-CoV-2 nucleic acids MAY BE PRESENT.   A presumptive positive result was obtained on the submitted specimen  and confirmed on repeat testing.  While 2019 novel coronavirus  (SARS-CoV-2) nucleic acids may be present in the submitted sample  additional confirmatory testing may be necessary for epidemiological  and / or clinical management purposes  to differentiate between  SARS-CoV-2 and other Sarbecovirus currently known to infect humans.  If clinically indicated additional testing with an alternate test  methodology [phone]) is advised. The SARS-CoV-2 RNA is generally  detectable in upper and lower respiratory sp ecimens during the acute  phase of infection. The expected result is Negative. Fact Sheet for Patients:  StrictlyIdeas.no Fact Sheet for Healthcare Providers: BankingDealers.co.za This test is not yet approved or cleared by the  [place] FDA and has been authorized for detection and/or diagnosis of SARS-CoV-2 by FDA under an Emergency Use Authorization (EUA).  This EUA will remain in effect (meaning this test can be used) for the duration of the COVID-19 declaration under Section 564(b)(1) of the Act, 21 U.S.C. section 360bbb-3(b)(1), unless the authorization is terminated or revoked sooner. Performed at [facility], [place] [address], [place], [place] [address]      Radiological Exams on Admission: Dg Chest Port 1 View  Result Date: 08/19/2019 CLINICAL DATA:  53 year old female with neutropenic fever. Undergoing chemotherapy for breast cancer. EXAM: PORTABLE CHEST 1 VIEW COMPARISON:  None. FINDINGS: Portable  AP semi upright view at 0021 hours. Postoperative changes to the left chest wall with tissue expander in place. Normal lung volumes and mediastinal contours. Allowing for portable technique the lungs are clear. Visualized tracheal air column is within normal limits. Chronic mid left clavicle fracture. No acute osseous abnormality identified. Paucity of bowel gas in the visible upper abdomen. IMPRESSION: No cardiopulmonary abnormality. Electronically Signed   By: Genevie Ann M.D.   On: 08/19/2019 01:11     EKG: Independently reviewed.  Sinus tachycardia, QTC 431, low voltage, nonspecific T wave change.  Assessment/Plan Principal Problem:   Neutropenia with fever (HCC) Active Problems:   Cancer of overlapping sites of left female breast ([place])   Sepsis ([place])   Depression   DVT (deep venous thrombosis) (HCC)   Abnormal LFTs   Sepsis due to neutropenia with fever [facility]): Patient meets criteria for sepsis with neutropenia, fever, tachycardia, tachypnea.  Currently hemodynamically stable.  Source of infection is not identified.  Urinalysis positive, but with squamous cell contamination.  Patient does not have symptoms of UTI.  Chest x-ray negative.  COVID-19 negative.  Patient has Port-A line,  which does not seem to be infected.  - will admit to tele bed as inpt - started vancomycin and cefepime  - blood culture x2 and urine culutre - will get Procalcitonin and trend lactic acid level per sepsis protocol  -IVF: 2.0 L of NS bolus, followed by75 cc/h  Cancer of overlapping sites of left female breast [facility]): pt is following up with Dr. Burr Medico. He last chemotherapy was a week ago -Follow-up with Dr. Burr Medico  Depression: no SI or HI -Wellbutrin and Lexapro  DVT (deep venous thrombosis) ([place]):  -Eliquis  Abnormal LFTs: ALP 111, AST 117, ALT 144, total bilirubin 0.2 -check hepatitis panel and HIV antibody -Avoid using Tylenol   Inpatient status:  # Patient requires inpatient status due to high intensity of service, high risk for further deterioration and high frequency of surveillance required.  I certify that at the point of admission it is my clinical judgment that the patient will require inpatient hospital care spanning beyond 2 midnights from the point of admission.  . This patient has multiple chronic comorbidities including breast cancer on chemotherapy, DVT on Eliquis, depression . Now patient has presenting symptoms include neutropenia fever, sepsis, abnormal liver function . The initial radiographic and laboratory data are worrisome because of neutropenia with WBC 1.9, sepsis . Current medical needs: please see my assessment and plan . Predictability of an adverse outcome (risk): Patient has history of breast cancer on chemotherapy. She presents with neutropenia fever with unclear source of infection.  Since patient is immunosuppressed, she is at high risk of deteriorating.  Will need to be treated in hospital for at least 2 days.      DVT ppx: on Eliquis Code Status: Full code Family Communication: None at bed side.  Disposition Plan:  Anticipate discharge back to previous home environment Consults called:  none Admission status:  Inpatient/tele      Date of  Service 08/19/2019    Ivor Costa [facility]   If 7PM-7AM, please contact night-coverage [email or web address] Password [facility] 08/19/2019, 5:34 AM

## 2019-08-20 LAB — COMPREHENSIVE METABOLIC PANEL
ALT: 158 U/L — ABNORMAL HIGH (ref 0–44)
AST: 73 U/L — ABNORMAL HIGH (ref 15–41)
Albumin: 3.6 g/dL (ref 3.5–5.0)
Alkaline Phosphatase: 131 U/L — ABNORMAL HIGH (ref 38–126)
Anion gap: 10 (ref 5–15)
BUN: 7 mg/dL (ref 6–20)
CO2: 24 mmol/L (ref 22–32)
Calcium: 8.9 mg/dL (ref 8.9–10.3)
Chloride: 107 mmol/L (ref 98–111)
Creatinine, Ser: 0.8 mg/dL (ref 0.44–1.00)
GFR calc Af Amer: >60 mL/min (ref >60)
GFR calc non Af Amer: >60 mL/min (ref >60)
Glucose, Bld: 98 mg/dL (ref 70–99)
Potassium: 4.3 mmol/L (ref 3.5–5.1)
Sodium: 141 mmol/L (ref 135–145)
Total Bilirubin: 0.3 mg/dL (ref 0.3–1.2)
Total Protein: 6.3 g/dL — ABNORMAL LOW (ref 6.5–8.1)

## 2019-08-20 LAB — CBC WITH DIFFERENTIAL/PLATELET
Abs Immature Granulocytes: 4.67 10*3/uL — ABNORMAL HIGH (ref 0.00–0.07)
Band Neutrophils: 8 %
Basophils Absolute: 0.3 10*3/uL — ABNORMAL HIGH (ref 0.0–0.1)
Basophils Relative: 2 %
Blasts: 0 %
Eosinophils Absolute: 0.1 10*3/uL (ref 0.0–0.5)
Eosinophils Relative: 1 %
HCT: 33.7 % — ABNORMAL LOW (ref 36.0–46.0)
Hemoglobin: 10.9 g/dL — ABNORMAL LOW (ref 12.0–15.0)
Lymphocytes Relative: 20 %
Lymphs Abs: 2.9 10*3/uL (ref 0.7–4.0)
MCH: 30.1 pg (ref 26.0–34.0)
MCHC: 32.3 g/dL (ref 30.0–36.0)
MCV: 93.1 fL (ref 80.0–100.0)
Metamyelocytes Relative: 16 %
Monocytes Absolute: 1.2 10*3/uL — ABNORMAL HIGH (ref 0.1–1.0)
Monocytes Relative: 8 %
Myelocytes: 11 %
Neutro Abs: 5.1 10*3/uL (ref 1.7–7.7)
Neutrophils Relative %: 27 %
Other: 2 %
Platelets: 158 10*3/uL (ref 150–400)
Promyelocytes Relative: 5 %
RBC: 3.62 MIL/uL — ABNORMAL LOW (ref 3.87–5.11)
RDW: 13.1 % (ref 11.5–15.5)
WBC: 14.6 10*3/uL — ABNORMAL HIGH (ref 4.0–10.5)
nRBC: 0.6 % — ABNORMAL HIGH (ref 0.0–0.2)
nRBC: 2 /100 WBC — ABNORMAL HIGH

## 2019-08-20 LAB — URINE CULTURE: Culture: NO GROWTH

## 2019-08-20 MED ORDER — DIPHENHYDRAMINE HCL 25 MG PO CAPS
25.0000 mg | ORAL_CAPSULE | Freq: Once | ORAL | Status: AC
  Administered 2019-08-20: 21:00:00 25 mg via ORAL
  Filled 2019-08-20: qty 1

## 2019-08-20 NOTE — Progress Notes (Signed)
PROGRESS NOTE    Megan Kent  BVA:[id number] DOB: 15-Jun-1966 DOA: 08/18/2019 PCP: Megan Fess, MD      Brief Narrative:  Mrs. Megan Kent is a 53 y.o. F with BrCA on docetaxel/cyclophosphamide, hx DVT on ELiquis, and depression who presented with fever.  In the ER, ANC 200/uL.  Lactate 2.6.  Mild transaminiits, new from the day before.  UA showed pyuria, rare bacteria.  CXR clear.       Assessment & Plan:  Neutropenic fever Afebrile overnight.  Received GCSF 9/28, WBC normalized today. -Continue cefepime, day 3 -Follow culture data -Consult Oncology, appreicate cares   History DVT Doubt PE, no chest pain or hypoxia.  -Continue ELiquis  Depression -Continue escitalopram, Wellbutrin  Transaminitis HIV and hepatitis serologies negative.  AST down, ALT no change. Exam benign -Trend LFTs           MDM and disposition: The below labs and imaging reports were reviewed and summarized above.  Medication management as above.  The patient was admitted with neutropenic fever.  ANC now normalized.  Symptoms improving.  No fever last 24 hours.  If cultures negative tomorrow at 48 hours, will stop cefepime and discharge with Oncology follow up.   Monitor fever curve, ANC.  Possibly home in 24 to 48 hours.         DVT prophylaxis: N/A on ELiquis Code Status: FULL Family Communication: Husband at bedside    Consultants:   Oncology  Procedures:     Antimicrobials:   Vancomycin x1  Cefepime  10/1 >>   Culture data:   10/1 blood culture x2- NGTD  10/1 urine culture -- 50K lactobacillus  10/2 blood culture x2- NGTD  10/2 urine culture -- NG    Subjective: Feeling better . No headache, neck pain, chest pain, dyspnea, cough, sputum.  No abdominal pain, vomiting, RUQ pain.  No diarrhea.   Objective: Vitals:   08/19/19 1400 08/19/19 1841 08/20/19 0048 08/20/19 0420  BP: 132/71 112/69 122/78 112/72  Pulse: (!) 104 (!) 108 (!) 119 (!) 108  Resp:  18 18 17 18   Temp: 98.1 F (36.7 C) 98.3 F (36.8 C) 99.9 F (37.7 C) 99.5 F (37.5 C)  TempSrc: Oral Oral Oral Oral  SpO2: 99% 95% 93% 96%  Weight:      Height:        Intake/Output Summary (Last 24 hours) at 08/20/2019 1320 Last data filed at 08/20/2019 0956 Gross per 24 hour  Intake 1250.54 ml  Output 1 ml  Net 1249.54 ml   Filed Weights   08/19/19 0800 08/19/19 1046  Weight: 67 kg 67 kg    Examination: General appearance:  adult female, alert and in no acute distress.   HEENT: Anicteric, conjunctiva pink, lids and lashes normal. No nasal deformity, discharge, epistaxis.  Lips moist, dentition normal, OP moist, no oral lesions, hearing normal.   Skin: Warm and dry.  no jaundice.  No suspicious rashes or lesions. Cardiac: tachycardic, regualr, nl S1-S2, no murmurs appreciated.  Capillary refill is brisk.  JVP normal.  No LE edema.  Radial pulses 2+ and symmetric. Respiratory: Normal respiratory rate and rhythm.  CTAB without rales or wheezes. Abdomen: Abdomen soft.  No TTP at all, no guarding. No ascites, distension, hepatosplenomegaly.   MSK: No deformities or effusions. Neuro: Awake and alert.  EOMI, moves all extremities. Speech fluent.    Psych: Sensorium intact and responding to questions, attention normal. Affect normal.  Judgment and insight appear normal.    Data  Reviewed: I have personally reviewed following labs and imaging studies:  CBC: Recent Labs  Lab 08/18/19 1155 08/19/19 0023 08/19/19 0609 08/20/19 0607  WBC 1.9* 1.4* 1.9* 14.6*  NEUTROABS 0.6* 0.2*  --  5.1  HGB 12.7 11.5* 9.8* 10.9*  HCT 39.1 35.7* 30.9* 33.7*  MCV 92.0 92.7 94.8 93.1  PLT 190 164 134* 035   Basic Metabolic Panel: Recent Labs  Lab 08/18/19 1155 08/19/19 0023 08/19/19 0609 08/20/19 0607  NA 139 137 140 141  K 4.2 4.2 4.2 4.3  CL 105 104 111 107  CO2 25 22 21* 24  GLUCOSE 147* 124* 97 98  BUN 15 15 11 7   CREATININE 0.96 0.84 0.78 0.80  CALCIUM 9.3 9.0 7.9* 8.9    GFR: Estimated Creatinine Clearance: 78.8 mL/min (by C-G formula based on SCr of 0.8 mg/dL). Liver Function Tests: Recent Labs  Lab 08/18/19 1155 08/19/19 0023 08/20/19 0607  AST 21 117* 73*  ALT 44 144* 158*  ALKPHOS 109 115 131*  BILITOT 0.5 0.2* 0.3  PROT 7.2 7.0 6.3*  ALBUMIN 4.2 4.2 3.6   No results for input(s): LIPASE, AMYLASE in the last 168 hours. No results for input(s): AMMONIA in the last 168 hours. Coagulation Profile: No results for input(s): INR, PROTIME in the last 168 hours. Cardiac Enzymes: No results for input(s): CKTOTAL, CKMB, CKMBINDEX, TROPONINI in the last 168 hours. BNP (last 3 results) No results for input(s): PROBNP in the last 8760 hours. HbA1C: No results for input(s): HGBA1C in the last 72 hours. CBG: No results for input(s): GLUCAP in the last 168 hours. Lipid Profile: No results for input(s): CHOL, HDL, LDLCALC, TRIG, CHOLHDL, LDLDIRECT in the last 72 hours. Thyroid Function Tests: No results for input(s): TSH, T4TOTAL, FREET4, T3FREE, THYROIDAB in the last 72 hours. Anemia Panel: No results for input(s): VITAMINB12, FOLATE, FERRITIN, TIBC, IRON, RETICCTPCT in the last 72 hours. Urine analysis:    Component Value Date/Time   COLORURINE AMBER (A) 08/18/2019 1256   APPEARANCEUR HAZY (A) 08/18/2019 1256   LABSPEC 1.029 08/18/2019 1256   PHURINE 5.0 08/18/2019 1256   GLUCOSEU NEGATIVE 08/18/2019 1256   HGBUR NEGATIVE 08/18/2019 1256   BILIRUBINUR SMALL (A) 08/18/2019 1256   KETONESUR 5 (A) 08/18/2019 1256   PROTEINUR 30 (A) 08/18/2019 1256   NITRITE NEGATIVE 08/18/2019 1256   LEUKOCYTESUR LARGE (A) 08/18/2019 1256   Sepsis Labs: @LABRCNTIP (procalcitonin:4,lacticacidven:4)  ) Recent Results (from the past 240 hour(s))  Culture, Blood     Status: None (Preliminary result)   Collection Time: 08/18/19 12:55 PM   Specimen: BLOOD  Result Value Ref Range Status   Specimen Description BLOOD SITE NOT SPECIFIED  Final   Special Requests    Final    BOTTLES DRAWN AEROBIC AND ANAEROBIC Blood Culture adequate volume   Culture   Final    NO GROWTH 2 DAYS Performed at [facility], [place] [address] [place], [place] [address]    Report Status PENDING  Incomplete  Urine Culture     Status: Abnormal   Collection Time: 08/18/19 12:57 PM   Specimen: Urine, Clean Catch  Result Value Ref Range Status   Specimen Description   Final    URINE, CLEAN CATCH Performed at [facility], [place] [address], [place], [place] [address]    Special Requests   Final    NONE Performed at [facility], [place] [address], [place], [place] [address]    Culture (A)  Final  50,000 COLONIES/mL LACTOBACILLUS SPECIES Standardized susceptibility testing for this organism is not available. Performed at [facility], [place] [address], [place], [place] [address]    Report Status 08/19/2019 FINAL  Final  Culture, Blood     Status: None (Preliminary result)   Collection Time: 08/18/19 12:59 PM   Specimen: BLOOD  Result Value Ref Range Status   Specimen Description   Final    BLOOD Performed at [facility], [place] [address], [place], [place] [address]    Special Requests   Final    NONE Performed at [facility], [place] [address], [place], [place] [address]    Culture   Final    NO GROWTH 2 DAYS Performed at [place] [address] [place], [place] [address]    Report Status PENDING  Incomplete  SARS Coronavirus 2 [facility] order, Performed in [facility] hospital lab) Nasopharyngeal Nasopharyngeal Swab     Status: None   Collection Time: 08/19/19 12:29 AM   Specimen: Nasopharyngeal Swab  Result Value Ref Range Status   SARS Coronavirus 2 NEGATIVE NEGATIVE Final    Comment: (NOTE) If result is NEGATIVE SARS-CoV-2 target nucleic acids are NOT DETECTED. The SARS-CoV-2 RNA is generally detectable in upper and lower  respiratory  specimens during the acute phase of infection. The lowest  concentration of SARS-CoV-2 viral copies this assay can detect is 250  copies / mL. A negative result does not preclude SARS-CoV-2 infection  and should not be used as the sole basis for treatment or other  patient management decisions.  A negative result may occur with  improper specimen collection / handling, submission of specimen other  than nasopharyngeal swab, presence of viral mutation(s) within the  areas targeted by this assay, and inadequate number of viral copies  (<250 copies / mL). A negative result must be combined with clinical  observations, patient history, and epidemiological information. If result is POSITIVE SARS-CoV-2 target nucleic acids are DETECTED. The SARS-CoV-2 RNA is generally detectable in upper and lower  respiratory specimens dur ing the acute phase of infection.  Positive  results are indicative of active infection with SARS-CoV-2.  Clinical  correlation with patient history and other diagnostic information is  necessary to determine patient infection status.  Positive results do  not rule out bacterial infection or co-infection with other viruses. If result is PRESUMPTIVE POSTIVE SARS-CoV-2 nucleic acids MAY BE PRESENT.   A presumptive positive result was obtained on the submitted specimen  and confirmed on repeat testing.  While 2019 novel coronavirus  (SARS-CoV-2) nucleic acids may be present in the submitted sample  additional confirmatory testing may be necessary for epidemiological  and / or clinical management purposes  to differentiate between  SARS-CoV-2 and other Sarbecovirus currently known to infect humans.  If clinically indicated additional testing with an alternate test  methodology [phone]) is advised. The SARS-CoV-2 RNA is generally  detectable in upper and lower respiratory sp ecimens during the acute  phase of infection. The expected result is Negative. Fact Sheet for  Patients:  StrictlyIdeas.no Fact Sheet for Healthcare Providers: BankingDealers.co.za This test is not yet approved or cleared by the [place] FDA and has been authorized for detection and/or diagnosis of SARS-CoV-2 by FDA under an Emergency Use Authorization (EUA).  This EUA will remain in effect (meaning this test can be used) for the duration of the COVID-19 declaration under Section 564(b)(1) of the Act, 21 U.S.C. section 360bbb-3(b)(1),  unless the authorization is terminated or revoked sooner. Performed at [facility], [place] [address], [place], [place] [address]   Culture, blood (Routine X 2) w Reflex to ID Panel     Status: None (Preliminary result)   Collection Time: 08/19/19  4:05 AM   Specimen: BLOOD RIGHT HAND  Result Value Ref Range Status   Specimen Description   Final    BLOOD RIGHT HAND Performed at [place] [address], [place], [place] [address]    Special Requests   Final    BOTTLES DRAWN AEROBIC AND ANAEROBIC Blood Culture adequate volume Performed at [facility] [address], [place], [place] [address]    Culture   Final    NO GROWTH 1 DAY Performed at [facility], [place] [address], [place], [place] [address]    Report Status PENDING  Incomplete  Urine Culture     Status: None   Collection Time: 08/19/19  4:05 AM   Specimen: Urine, Clean Catch  Result Value Ref Range Status   Specimen Description   Final    URINE, CLEAN CATCH Performed at [facility], [place] [address], [place], [place] [address]    Special Requests   Final    NONE Performed at [facility], [place] [address], [place], [place] [address]    Culture   Final    NO GROWTH Performed at [facility], [place] [address], [place], [place] [address]    Report Status 08/20/2019 FINAL  Final  Culture, blood (Routine X 2) w Reflex to ID  Panel     Status: None (Preliminary result)   Collection Time: 08/19/19  4:10 AM   Specimen: BLOOD  Result Value Ref Range Status   Specimen Description   Final    BLOOD RIGHT ARM Performed at [place] [address] [place], [place] [address]    Special Requests   Final    BOTTLES DRAWN AEROBIC AND ANAEROBIC Blood Culture adequate volume Performed at [place] [address] [place], [place] [address]    Culture   Final    NO GROWTH 1 DAY Performed at [facility], [place] [address], [place],  [address]    Report Status PENDING  Incomplete         Radiology Studies: Dg Chest Port 1 View  Result Date: 08/19/2019 CLINICAL DATA:  53 year old female with neutropenic fever. Undergoing chemotherapy for breast cancer. EXAM: PORTABLE CHEST 1 VIEW COMPARISON:  None. FINDINGS: Portable AP semi upright view at 0021 hours. Postoperative changes to the left chest wall with tissue expander in place. Normal lung volumes and mediastinal contours. Allowing for portable technique the lungs are clear. Visualized tracheal air column is within normal limits. Chronic mid left clavicle fracture. No acute osseous abnormality identified. Paucity of bowel gas in the visible upper abdomen. IMPRESSION: No cardiopulmonary abnormality. Electronically Signed   By: Genevie Ann M.D.   On: 08/19/2019 01:11        Scheduled Meds: . apixaban  5 mg Oral BID  . buPROPion  150 mg Oral Daily  . escitalopram  20 mg Oral Daily  . polyethylene glycol  17 g Oral Daily   Continuous Infusions: . ceFEPime (MAXIPIME) IV 2 g (08/20/19 1235)     LOS: 1 day    Time spent: 25 minutes    Edwin Dada, MD [facility] 08/20/2019, 1:20 PM  Please page through AMION:  [email or web address] Password [email or web address] If 7PM-7AM, please contact night-coverage

## 2019-08-21 DIAGNOSIS — I824Y9 Acute embolism and thrombosis of unspecified deep veins of unspecified proximal lower extremity: Secondary | ICD-10-CM

## 2019-08-21 LAB — CBC WITH DIFFERENTIAL/PLATELET
Abs Immature Granulocytes: 11.6 10*3/uL — ABNORMAL HIGH (ref 0.00–0.07)
Band Neutrophils: 6 %
Basophils Absolute: 0.4 10*3/uL — ABNORMAL HIGH (ref 0.0–0.1)
Basophils Relative: 1 %
Eosinophils Absolute: 0 10*3/uL (ref 0.0–0.5)
Eosinophils Relative: 0 %
HCT: 33 % — ABNORMAL LOW (ref 36.0–46.0)
Hemoglobin: 10.3 g/dL — ABNORMAL LOW (ref 12.0–15.0)
Lymphocytes Relative: 11 %
Lymphs Abs: 4.6 10*3/uL — ABNORMAL HIGH (ref 0.7–4.0)
MCH: 29.9 pg (ref 26.0–34.0)
MCHC: 31.2 g/dL (ref 30.0–36.0)
MCV: 95.7 fL (ref 80.0–100.0)
Metamyelocytes Relative: 7 %
Monocytes Absolute: 3.3 10*3/uL — ABNORMAL HIGH (ref 0.1–1.0)
Monocytes Relative: 8 %
Myelocytes: 21 %
Neutro Abs: 21.6 10*3/uL — ABNORMAL HIGH (ref 1.7–7.7)
Neutrophils Relative %: 46 %
Platelets: 162 10*3/uL (ref 150–400)
RBC: 3.45 MIL/uL — ABNORMAL LOW (ref 3.87–5.11)
RDW: 13.6 % (ref 11.5–15.5)
WBC: 41.6 10*3/uL — ABNORMAL HIGH (ref 4.0–10.5)
nRBC: 0.3 % — ABNORMAL HIGH (ref 0.0–0.2)

## 2019-08-21 LAB — COMPREHENSIVE METABOLIC PANEL
ALT: 151 U/L — ABNORMAL HIGH (ref 0–44)
AST: 71 U/L — ABNORMAL HIGH (ref 15–41)
Albumin: 3.4 g/dL — ABNORMAL LOW (ref 3.5–5.0)
Alkaline Phosphatase: 174 U/L — ABNORMAL HIGH (ref 38–126)
Anion gap: 9 (ref 5–15)
BUN: 7 mg/dL (ref 6–20)
CO2: 25 mmol/L (ref 22–32)
Calcium: 8.6 mg/dL — ABNORMAL LOW (ref 8.9–10.3)
Chloride: 108 mmol/L (ref 98–111)
Creatinine, Ser: 0.81 mg/dL (ref 0.44–1.00)
GFR calc Af Amer: >60 mL/min (ref >60)
GFR calc non Af Amer: >60 mL/min (ref >60)
Glucose, Bld: 97 mg/dL (ref 70–99)
Potassium: 4.2 mmol/L (ref 3.5–5.1)
Sodium: 142 mmol/L (ref 135–145)
Total Bilirubin: 0.2 mg/dL — ABNORMAL LOW (ref 0.3–1.2)
Total Protein: 6 g/dL — ABNORMAL LOW (ref 6.5–8.1)

## 2019-08-21 MED ORDER — TRAZODONE HCL 50 MG PO TABS
50.0000 mg | ORAL_TABLET | Freq: Once | ORAL | Status: AC
  Administered 2019-08-21: 50 mg via ORAL
  Filled 2019-08-21: qty 1

## 2019-08-21 NOTE — Discharge Summary (Signed)
Physician Discharge Summary  Megan Kent LNL:[id number] DOB: 1966/02/19 DOA: 08/18/2019  PCP: Hulan Fess, MD  Admit date: 08/18/2019 Discharge date: 08/21/2019  Admitted From: Home  Disposition:  Home   Recommendations for Outpatient Follow-up:  1. Follow up with Oncology this week 2. Repeat CBC as directed by Oncology 3. Repeat LFTs in 2-3 weeks     Home Health: None  Equipment/Devices: None  Discharge Condition: Good  CODE STATUS: FULL Diet recommendation: Regular  Brief/Interim Summary: Megan Kent is a 53 y.o. F with BrCA on docetaxel/cyclophosphamide, hx DVT on ELiquis, and depression who presented with fever.  In the ER, ANC 200/uL.  Lactate 2.6.  Mild transaminiits, new from the day before.  UA showed pyuria, rare bacteria.  CXR clear.       PRINCIPAL HOSPITAL DIAGNOSIS: Neutropenic fever    Discharge Diagnoses:   Neutropenic fever Sepsis ruled out Admitted and started on cefepime.  Blood cultures negative at 48 hours.  Afebrile now 48 hours.  Urine culture growing lactobacillus, not likely pathogen.    Received GCSF 9/28, WBC responding yesterday, up to 40K today, discussed with Oncology by phone, expected response.   -Discontinue antibiotics -Return precautions given -Close Oncology follow up recommended   History DVT Doubt PE, no chest pain or hypoxia.   Depression  Transaminitis HIV and hepatitis serologies negative.  AST down, ALT no change. Exam benign, do not suspect gallbladder infection, more likely chemo-related.    -Repeat LFTs in 2-3 weeks           Discharge Instructions  Discharge Instructions    Discharge instructions   Complete by: As directed    From Dr. Loleta Books: You were admitted for febrile neutropenia. You completed 72 hours of broad spectrum antibiotics, and all of your cultures (urine, blood) were unremarkable.   You may stop antibiotics (including the amoxicillin/Augmentin you had before admission) and  resume your normal home medicines as you were taking them before admission.  I have sent a message to Dr. Ernestina Penna office.   If you haven't heard from them by Tuesday, call them to arrange appropriate follow up. Return for any recurrent fever, or any symptoms that seem related to infection (cough, coughing up mucus, abdominal pain that won't go away, bladder pain or pain with urinating)   Increase activity slowly   Complete by: As directed      Allergies as of 08/21/2019   No Known Allergies     Medication List    STOP taking these medications   amoxicillin-clavulanate 875-125 MG tablet Commonly known as: AUGMENTIN     TAKE these medications   buPROPion 150 MG 24 hr tablet Commonly known as: WELLBUTRIN XL Take 150 mg by mouth daily.   dexamethasone 4 MG tablet Commonly known as: DECADRON Take 1 tablet (4 mg total) by mouth 2 (two) times daily with a meal. 1 tablet (76m) 2 times, morning and afternoon,  the day before chemo; 1 tablet daily x 3 days starting the day after chemo   Eliquis 5 MG Tabs tablet Generic drug: apixaban Take 5 mg by mouth 2 (two) times daily.   escitalopram 20 MG tablet Commonly known as: LEXAPRO Take 20 mg by mouth daily.   ondansetron 8 MG tablet Commonly known as: ZOFRAN Take 1 tablet (8 mg total) by mouth 2 (two) times daily.   prochlorperazine 10 MG tablet Commonly known as: COMPAZINE Take 1 tablet (10 mg total) by mouth every 6 (six) hours as needed for nausea or vomiting.  No Known Allergies  Consultations:  Oncology   Procedures/Studies: Dg Chest Port 1 View  Result Date: 08/19/2019 CLINICAL DATA:  53 year old female with neutropenic fever. Undergoing chemotherapy for breast cancer. EXAM: PORTABLE CHEST 1 VIEW COMPARISON:  None. FINDINGS: Portable AP semi upright view at 0021 hours. Postoperative changes to the left chest wall with tissue expander in place. Normal lung volumes and mediastinal contours. Allowing for portable  technique the lungs are clear. Visualized tracheal air column is within normal limits. Chronic mid left clavicle fracture. No acute osseous abnormality identified. Paucity of bowel gas in the visible upper abdomen. IMPRESSION: No cardiopulmonary abnormality. Electronically Signed   By: Genevie Ann M.D.   On: 08/19/2019 01:11       Subjective: Feelng well.  No headache, neck pain, no abdoinal pain, flank pain, dysuria, cough, sputum.  Discharge Exam: Vitals:   08/21/19 0454 08/21/19 0815  BP: 115/68   Pulse: 94   Resp: 15   Temp: 98.6 F (37 C)   SpO2: 95% 95%   Vitals:   08/20/19 1350 08/20/19 2108 08/21/19 0454 08/21/19 0815  BP: 120/62 116/71 115/68   Pulse: (!) 110 (!) 103 94   Resp: 16 15 15    Temp: 98.3 F (36.8 C) 98.4 F (36.9 C) 98.6 F (37 C)   TempSrc: Oral Oral Oral   SpO2: 96% 97% 95% 95%  Weight:      Height:        General: Pt is alert, awake, not in acute distress Cardiovascular: RRR, nl S1-S2, no murmurs appreciated.   No LE edema.   Respiratory: Normal respiratory rate and rhythm.  CTAB without rales or wheezes. Abdominal: Abdomen soft and non-tender.  No distension or HSM.   Neuro/Psych: Strength symmetric in upper and lower extremities.  Judgment and insight appear normal.   The results of significant diagnostics from this hospitalization (including imaging, microbiology, ancillary and laboratory) are listed below for reference.     Microbiology: Recent Results (from the past 240 hour(s))  Culture, Blood     Status: None (Preliminary result)   Collection Time: 08/18/19 12:55 PM   Specimen: BLOOD  Result Value Ref Range Status   Specimen Description BLOOD SITE NOT SPECIFIED  Final   Special Requests   Final    BOTTLES DRAWN AEROBIC AND ANAEROBIC Blood Culture adequate volume   Culture   Final    NO GROWTH 3 DAYS Performed at [facility], [address], [place], [place] [address]    Report Status PENDING  Incomplete  Urine Culture      Status: Abnormal   Collection Time: 08/18/19 12:57 PM   Specimen: Urine, Clean Catch  Result Value Ref Range Status   Specimen Description   Final    URINE, CLEAN CATCH Performed at [facility], [place] [address], [place], [place] [address]    Special Requests   Final    NONE Performed at [facility], [place] [address] [place], [place] [address]    Culture (A)  Final    50,000 COLONIES/mL LACTOBACILLUS SPECIES Standardized susceptibility testing for this organism is not available. Performed at [facility], [place] [address], [place], [place] [address]    Report Status 08/19/2019 FINAL  Final  Culture, Blood     Status: None (Preliminary result)   Collection Time: 08/18/19 12:59 PM   Specimen: BLOOD  Result Value Ref Range Status   Specimen Description   Final  BLOOD Performed at [facility], [place] [address] [place], [place] [address]    Special Requests   Final    NONE Performed at [facility], [place] [address], [place], [place] [address]    Culture   Final    NO GROWTH 3 DAYS Performed at [facility], [place] [address] [place], [place] [address]    Report Status PENDING  Incomplete  SARS Coronavirus 2 [facility] order, Performed in [facility] hospital lab) Nasopharyngeal Nasopharyngeal Swab     Status: None   Collection Time: 08/19/19 12:29 AM   Specimen: Nasopharyngeal Swab  Result Value Ref Range Status   SARS Coronavirus 2 NEGATIVE NEGATIVE Final    Comment: (NOTE) If result is NEGATIVE SARS-CoV-2 target nucleic acids are NOT DETECTED. The SARS-CoV-2 RNA is generally detectable in upper and lower  respiratory specimens during the acute phase of infection. The lowest  concentration of SARS-CoV-2 viral copies this assay can detect is 250  copies / mL. A negative result does not preclude SARS-CoV-2 infection  and should not be used as the sole basis for treatment  or other  patient management decisions.  A negative result may occur with  improper specimen collection / handling, submission of specimen other  than nasopharyngeal swab, presence of viral mutation(s) within the  areas targeted by this assay, and inadequate number of viral copies  (<250 copies / mL). A negative result must be combined with clinical  observations, patient history, and epidemiological information. If result is POSITIVE SARS-CoV-2 target nucleic acids are DETECTED. The SARS-CoV-2 RNA is generally detectable in upper and lower  respiratory specimens dur ing the acute phase of infection.  Positive  results are indicative of active infection with SARS-CoV-2.  Clinical  correlation with patient history and other diagnostic information is  necessary to determine patient infection status.  Positive results do  not rule out bacterial infection or co-infection with other viruses. If result is PRESUMPTIVE POSTIVE SARS-CoV-2 nucleic acids MAY BE PRESENT.   A presumptive positive result was obtained on the submitted specimen  and confirmed on repeat testing.  While 2019 novel coronavirus  (SARS-CoV-2) nucleic acids may be present in the submitted sample  additional confirmatory testing may be necessary for epidemiological  and / or clinical management purposes  to differentiate between  SARS-CoV-2 and other Sarbecovirus currently known to infect humans.  If clinically indicated additional testing with an alternate test  methodology [phone]) is advised. The SARS-CoV-2 RNA is generally  detectable in upper and lower respiratory sp ecimens during the acute  phase of infection. The expected result is Negative. Fact Sheet for Patients:  StrictlyIdeas.no Fact Sheet for Healthcare Providers: BankingDealers.co.za This test is not yet approved or cleared by the [place] FDA and has been authorized for detection and/or diagnosis of  SARS-CoV-2 by FDA under an Emergency Use Authorization (EUA).  This EUA will remain in effect (meaning this test can be used) for the duration of the COVID-19 declaration under Section 564(b)(1) of the Act, 21 U.S.C. section 360bbb-3(b)(1), unless the authorization is terminated or revoked sooner. Performed at [facility], [place] [address], [place], [place] [address]   Culture, blood (Routine X 2) w Reflex to ID Panel     Status: None (Preliminary result)   Collection Time: 08/19/19  4:05 AM   Specimen: BLOOD RIGHT HAND  Result Value Ref Range Status   Specimen Description   Final    BLOOD RIGHT  HAND Performed at [facility], [place] [address] [place], [place] [address]    Special Requests   Final    BOTTLES DRAWN AEROBIC AND ANAEROBIC Blood Culture adequate volume Performed at [place] [address] [place], [place] [address]    Culture   Final    NO GROWTH 2 DAYS Performed at [place] [address], [place], [place] [address]    Report Status PENDING  Incomplete  Urine Culture     Status: None   Collection Time: 08/19/19  4:05 AM   Specimen: Urine, Clean Catch  Result Value Ref Range Status   Specimen Description   Final    URINE, CLEAN CATCH Performed at [facility], [place] [address], [place], [place] [address]    Special Requests   Final    NONE Performed at [facility], [place] [address] [place], [place] [address]    Culture   Final    NO GROWTH Performed at [facility], [place] [address] [place], [place] [address]    Report Status 08/20/2019 FINAL  Final  Culture, blood (Routine X 2) w Reflex to ID Panel     Status: None (Preliminary result)   Collection Time: 08/19/19  4:10 AM   Specimen: BLOOD  Result Value Ref Range Status   Specimen Description   Final    BLOOD RIGHT ARM Performed at [place] [address]  [place], [place] [address]    Special Requests   Final    BOTTLES DRAWN AEROBIC AND ANAEROBIC Blood Culture adequate volume Performed at [place] [address], [place], [place] [address]    Culture   Final    NO GROWTH 2 DAYS Performed at [place] [address] [place],  [address]    Report Status PENDING  Incomplete     Labs: BNP (last 3 results) No results for input(s): BNP in the last 8760 hours. Basic Metabolic Panel: Recent Labs  Lab 08/18/19 1155 08/19/19 0023 08/19/19 0609 08/20/19 0607 08/21/19 0431  NA 139 137 140 141 142  K 4.2 4.2 4.2 4.3 4.2  CL 105 104 111 107 108  CO2 25 22 21* 24 25  GLUCOSE 147* 124* 97 98 97  BUN 15 15 11 7 7   CREATININE 0.96 0.84 0.78 0.80 0.81  CALCIUM 9.3 9.0 7.9* 8.9 8.6*   Liver Function Tests: Recent Labs  Lab 08/18/19 1155 08/19/19 0023 08/20/19 0607 08/21/19 0431  AST 21 117* 73* 71*  ALT 44 144* 158* 151*  ALKPHOS 109 115 131* 174*  BILITOT 0.5 0.2* 0.3 0.2*  PROT 7.2 7.0 6.3* 6.0*  ALBUMIN 4.2 4.2 3.6 3.4*   No results for input(s): LIPASE, AMYLASE in the last 168 hours. No results for input(s): AMMONIA in the last 168 hours. CBC: Recent Labs  Lab 08/18/19 1155 08/19/19 0023 08/19/19 0609 08/20/19 0607 08/21/19 0431  WBC 1.9* 1.4* 1.9* 14.6* 41.6*  NEUTROABS 0.6* 0.2*  --  5.1 21.6*  HGB 12.7 11.5* 9.8* 10.9* 10.3*  HCT 39.1 35.7* 30.9* 33.7* 33.0*  MCV 92.0 92.7 94.8 93.1 95.7  PLT 190 164 134* 158 162   Cardiac Enzymes: No results for input(s): CKTOTAL, CKMB, CKMBINDEX, TROPONINI in the last 168 hours. BNP: Invalid input(s): POCBNP CBG: No results for input(s): GLUCAP in the last 168 hours. D-Dimer No results for input(s): DDIMER in the last 72 hours. Hgb A1c  No results for input(s): HGBA1C in the last 72 hours. Lipid Profile No results for input(s): CHOL, HDL, LDLCALC, TRIG, CHOLHDL, LDLDIRECT in the last 72 hours. Thyroid function studies No results for  input(s): TSH, T4TOTAL, T3FREE, THYROIDAB in the last 72 hours.  Invalid input(s): FREET3 Anemia work up No results for input(s): VITAMINB12, FOLATE, FERRITIN, TIBC, IRON, RETICCTPCT in the last 72 hours. Urinalysis    Component Value Date/Time   COLORURINE AMBER (A) 08/18/2019 1256   APPEARANCEUR HAZY (A) 08/18/2019 1256   LABSPEC 1.029 08/18/2019 1256   PHURINE 5.0 08/18/2019 1256   GLUCOSEU NEGATIVE 08/18/2019 1256   HGBUR NEGATIVE 08/18/2019 1256   BILIRUBINUR SMALL (A) 08/18/2019 1256   KETONESUR 5 (A) 08/18/2019 1256   PROTEINUR 30 (A) 08/18/2019 1256   NITRITE NEGATIVE 08/18/2019 1256   LEUKOCYTESUR LARGE (A) 08/18/2019 1256   Sepsis Labs Invalid input(s): PROCALCITONIN,  WBC,  LACTICIDVEN Microbiology Recent Results (from the past 240 hour(s))  Culture, Blood     Status: None (Preliminary result)   Collection Time: 08/18/19 12:55 PM   Specimen: BLOOD  Result Value Ref Range Status   Specimen Description BLOOD SITE NOT SPECIFIED  Final   Special Requests   Final    BOTTLES DRAWN AEROBIC AND ANAEROBIC Blood Culture adequate volume   Culture   Final    NO GROWTH 3 DAYS Performed at [facility], [address], [place], [place] [address]    Report Status PENDING  Incomplete  Urine Culture     Status: Abnormal   Collection Time: 08/18/19 12:57 PM   Specimen: Urine, Clean Catch  Result Value Ref Range Status   Specimen Description   Final    URINE, CLEAN CATCH Performed at [facility], [place] [address] [place], [place] [address]    Special Requests   Final    NONE Performed at [facility], [place] [address], [place], [place] [address]    Culture (A)  Final    50,000 COLONIES/mL LACTOBACILLUS SPECIES Standardized susceptibility testing for this organism is not available. Performed at [facility], [place] [address], [place], [place] [address]    Report Status 08/19/2019 FINAL  Final  Culture, Blood      Status: None (Preliminary result)   Collection Time: 08/18/19 12:59 PM   Specimen: BLOOD  Result Value Ref Range Status   Specimen Description   Final    BLOOD Performed at [facility], [place] [address] [place], [place] [address]    Special Requests   Final    NONE Performed at [facility], [place] [address], [place], [place] [address]    Culture   Final    NO GROWTH 3 DAYS Performed at [facility], [place] [address], [place], [place] [address]    Report Status PENDING  Incomplete  SARS Coronavirus 2 [facility] order, Performed in [facility] hospital lab) Nasopharyngeal Nasopharyngeal Swab     Status: None   Collection Time: 08/19/19 12:29 AM   Specimen: Nasopharyngeal Swab  Result Value Ref Range Status   SARS Coronavirus 2 NEGATIVE NEGATIVE Final    Comment: (NOTE) If result is NEGATIVE SARS-CoV-2 target nucleic acids are NOT DETECTED. The SARS-CoV-2 RNA is generally detectable in upper and lower  respiratory specimens during the acute phase of infection. The lowest  concentration of SARS-CoV-2 viral copies this assay can detect is 250  copies / mL. A negative result does  not preclude SARS-CoV-2 infection  and should not be used as the sole basis for treatment or other  patient management decisions.  A negative result may occur with  improper specimen collection / handling, submission of specimen other  than nasopharyngeal swab, presence of viral mutation(s) within the  areas targeted by this assay, and inadequate number of viral copies  (<250 copies / mL). A negative result must be combined with clinical  observations, patient history, and epidemiological information. If result is POSITIVE SARS-CoV-2 target nucleic acids are DETECTED. The SARS-CoV-2 RNA is generally detectable in upper and lower  respiratory specimens dur ing the acute phase of infection.  Positive  results are indicative of active infection with  SARS-CoV-2.  Clinical  correlation with patient history and other diagnostic information is  necessary to determine patient infection status.  Positive results do  not rule out bacterial infection or co-infection with other viruses. If result is PRESUMPTIVE POSTIVE SARS-CoV-2 nucleic acids MAY BE PRESENT.   A presumptive positive result was obtained on the submitted specimen  and confirmed on repeat testing.  While 2019 novel coronavirus  (SARS-CoV-2) nucleic acids may be present in the submitted sample  additional confirmatory testing may be necessary for epidemiological  and / or clinical management purposes  to differentiate between  SARS-CoV-2 and other Sarbecovirus currently known to infect humans.  If clinically indicated additional testing with an alternate test  methodology [phone]) is advised. The SARS-CoV-2 RNA is generally  detectable in upper and lower respiratory sp ecimens during the acute  phase of infection. The expected result is Negative. Fact Sheet for Patients:  StrictlyIdeas.no Fact Sheet for Healthcare Providers: BankingDealers.co.za This test is not yet approved or cleared by the [place] FDA and has been authorized for detection and/or diagnosis of SARS-CoV-2 by FDA under an Emergency Use Authorization (EUA).  This EUA will remain in effect (meaning this test can be used) for the duration of the COVID-19 declaration under Section 564(b)(1) of the Act, 21 U.S.C. section 360bbb-3(b)(1), unless the authorization is terminated or revoked sooner. Performed at [facility], [place] [address], [place], [place] [address]   Culture, blood (Routine X 2) w Reflex to ID Panel     Status: None (Preliminary result)   Collection Time: 08/19/19  4:05 AM   Specimen: BLOOD RIGHT HAND  Result Value Ref Range Status   Specimen Description   Final    BLOOD RIGHT HAND Performed at [place] [address] [place], [place] [address]    Special Requests   Final    BOTTLES DRAWN AEROBIC AND ANAEROBIC Blood Culture adequate volume Performed at [place] [address] [place], [place] [address]    Culture   Final    NO GROWTH 2 DAYS Performed at [place] [address], [place], [place] [address]    Report Status PENDING  Incomplete  Urine Culture     Status: None   Collection Time: 08/19/19  4:05 AM   Specimen: Urine, Clean Catch  Result Value Ref Range Status   Specimen Description   Final    URINE, CLEAN CATCH Performed at [facility], [place] [address], [place], [place] [address]    Special Requests   Final    NONE Performed at [facility], [place] [address] [place], [place] [address]    Culture   Final    NO GROWTH Performed at [facility],  [address] [place], [place] [address]    Report Status 08/20/2019 FINAL  Final  Culture, blood (Routine X 2) w Reflex to ID Panel     Status: None (Preliminary result)   Collection Time: 08/19/19  4:10 AM   Specimen: BLOOD  Result Value Ref Range Status   Specimen Description   Final    BLOOD RIGHT ARM Performed at [place] [address], [place], [place] [address]    Special Requests   Final    BOTTLES DRAWN AEROBIC AND ANAEROBIC Blood Culture adequate volume Performed at [place] [address], [place], [place] [address]    Culture   Final    NO GROWTH 2 DAYS Performed at [place] [address] [place], [place] [address]    Report Status PENDING  Incomplete     Time coordinating discharge: 25 minutes      SIGNED:   Edwin Dada, MD  [facility] 08/21/2019, 10:20 AM

## 2019-08-21 NOTE — Progress Notes (Signed)
Patient discharged to home with family, discharge instructions reviewed with patient who verbalized understanding. No new medications. 

## 2019-08-23 LAB — CULTURE, BLOOD (SINGLE)
Culture: NO GROWTH
Culture: NO GROWTH
Special Requests: ADEQUATE

## 2019-08-24 LAB — CULTURE, BLOOD (ROUTINE X 2)
Culture: NO GROWTH
Culture: NO GROWTH
Special Requests: ADEQUATE
Special Requests: ADEQUATE

## 2019-08-29 ENCOUNTER — Encounter: Payer: Self-pay | Admitting: Hematology

## 2019-08-29 MED ORDER — LORAZEPAM 0.5 MG PO TABS: 0.5000 mg | ORAL_TABLET | Freq: Every evening | ORAL | 0 refills | Status: DC | PRN
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
## 2019-09-02 ENCOUNTER — Inpatient Hospital Stay (HOSPITAL_BASED_OUTPATIENT_CLINIC_OR_DEPARTMENT_OTHER): Payer: BC Managed Care – PPO | Admitting: Hematology

## 2019-09-02 ENCOUNTER — Telehealth: Payer: Self-pay | Admitting: Hematology

## 2019-09-02 ENCOUNTER — Other Ambulatory Visit: Payer: Self-pay

## 2019-09-02 ENCOUNTER — Encounter: Payer: Self-pay | Admitting: Hematology

## 2019-09-02 ENCOUNTER — Inpatient Hospital Stay: Payer: BC Managed Care – PPO

## 2019-09-02 VITALS — BP 130/67 | HR 80 | Temp 98.2°F | Resp 18 | Ht 66.0 in | Wt 151.9 lb

## 2019-09-02 DIAGNOSIS — R5383 Other fatigue: Secondary | ICD-10-CM | POA: Diagnosis not present

## 2019-09-02 DIAGNOSIS — Z170 Estrogen receptor positive status [ER+]: Secondary | ICD-10-CM

## 2019-09-02 DIAGNOSIS — C50812 Malignant neoplasm of overlapping sites of left female breast: Secondary | ICD-10-CM

## 2019-09-02 DIAGNOSIS — R5081 Fever presenting with conditions classified elsewhere: Secondary | ICD-10-CM | POA: Diagnosis not present

## 2019-09-02 DIAGNOSIS — Z79899 Other long term (current) drug therapy: Secondary | ICD-10-CM | POA: Diagnosis not present

## 2019-09-02 DIAGNOSIS — I824Y9 Acute embolism and thrombosis of unspecified deep veins of unspecified proximal lower extremity: Secondary | ICD-10-CM

## 2019-09-02 DIAGNOSIS — R531 Weakness: Secondary | ICD-10-CM | POA: Diagnosis not present

## 2019-09-02 DIAGNOSIS — D709 Neutropenia, unspecified: Secondary | ICD-10-CM | POA: Diagnosis not present

## 2019-09-02 DIAGNOSIS — K5900 Constipation, unspecified: Secondary | ICD-10-CM | POA: Diagnosis not present

## 2019-09-02 DIAGNOSIS — Z5111 Encounter for antineoplastic chemotherapy: Secondary | ICD-10-CM | POA: Diagnosis not present

## 2019-09-02 DIAGNOSIS — Z5189 Encounter for other specified aftercare: Secondary | ICD-10-CM | POA: Diagnosis not present

## 2019-09-02 DIAGNOSIS — R42 Dizziness and giddiness: Secondary | ICD-10-CM | POA: Diagnosis not present

## 2019-09-02 DIAGNOSIS — Z803 Family history of malignant neoplasm of breast: Secondary | ICD-10-CM | POA: Diagnosis not present

## 2019-09-02 LAB — CBC WITH DIFFERENTIAL (CANCER CENTER ONLY)
Abs Immature Granulocytes: 0.03 10*3/uL (ref 0.00–0.07)
Basophils Absolute: 0.1 10*3/uL (ref 0.0–0.1)
Basophils Relative: 1 %
Eosinophils Absolute: 0 10*3/uL (ref 0.0–0.5)
Eosinophils Relative: 0 %
HCT: 34.5 % — ABNORMAL LOW (ref 36.0–46.0)
Hemoglobin: 11.5 g/dL — ABNORMAL LOW (ref 12.0–15.0)
Immature Granulocytes: 1 %
Lymphocytes Relative: 36 %
Lymphs Abs: 2 10*3/uL (ref 0.7–4.0)
MCH: 29.8 pg (ref 26.0–34.0)
MCHC: 33.3 g/dL (ref 30.0–36.0)
MCV: 89.4 fL (ref 80.0–100.0)
Monocytes Absolute: 0.7 10*3/uL (ref 0.1–1.0)
Monocytes Relative: 12 %
Neutro Abs: 2.8 10*3/uL (ref 1.7–7.7)
Neutrophils Relative %: 50 %
Platelet Count: 365 10*3/uL (ref 150–400)
RBC: 3.86 MIL/uL — ABNORMAL LOW (ref 3.87–5.11)
RDW: 13.9 % (ref 11.5–15.5)
WBC Count: 5.5 10*3/uL (ref 4.0–10.5)
nRBC: 0 % (ref 0.0–0.2)

## 2019-09-02 LAB — CMP (CANCER CENTER ONLY)
ALT: 66 U/L — ABNORMAL HIGH (ref 0–44)
AST: 21 U/L (ref 15–41)
Albumin: 4 g/dL (ref 3.5–5.0)
Alkaline Phosphatase: 133 U/L — ABNORMAL HIGH (ref 38–126)
Anion gap: 10 (ref 5–15)
BUN: 12 mg/dL (ref 6–20)
CO2: 27 mmol/L (ref 22–32)
Calcium: 9.1 mg/dL (ref 8.9–10.3)
Chloride: 107 mmol/L (ref 98–111)
Creatinine: 0.88 mg/dL (ref 0.44–1.00)
GFR, Est AFR Am: >60 mL/min (ref >60)
GFR, Estimated: >60 mL/min (ref >60)
Glucose, Bld: 99 mg/dL (ref 70–99)
Potassium: 3.6 mmol/L (ref 3.5–5.1)
Sodium: 144 mmol/L (ref 135–145)
Total Bilirubin: 0.3 mg/dL (ref 0.3–1.2)
Total Protein: 6.8 g/dL (ref 6.5–8.1)

## 2019-09-02 MED ORDER — PALONOSETRON HCL INJECTION 0.25 MG/5ML
0.2500 mg | Freq: Once | INTRAVENOUS | Status: AC
  Administered 2019-09-02: 0.25 mg via INTRAVENOUS

## 2019-09-02 MED ORDER — PALONOSETRON HCL INJECTION 0.25 MG/5ML
INTRAVENOUS | Status: AC
  Filled 2019-09-02: qty 5

## 2019-09-02 MED ORDER — SODIUM CHLORIDE 0.9 % IV SOLN
Freq: Once | INTRAVENOUS | Status: AC
  Administered 2019-09-02: 11:00:00 via INTRAVENOUS
  Filled 2019-09-02: qty 250

## 2019-09-02 MED ORDER — HEPARIN SOD (PORK) LOCK FLUSH 100 UNIT/ML IV SOLN
500.0000 [IU] | Freq: Once | INTRAVENOUS | Status: DC | PRN
  Filled 2019-09-02: qty 5

## 2019-09-02 MED ORDER — SODIUM CHLORIDE 0.9% FLUSH
10.0000 mL | INTRAVENOUS | Status: DC | PRN
  Filled 2019-09-02: qty 10

## 2019-09-02 MED ORDER — DEXAMETHASONE SODIUM PHOSPHATE 10 MG/ML IJ SOLN
10.0000 mg | Freq: Once | INTRAMUSCULAR | Status: AC
  Administered 2019-09-02: 10 mg via INTRAVENOUS

## 2019-09-02 MED ORDER — DEXAMETHASONE SODIUM PHOSPHATE 10 MG/ML IJ SOLN
INTRAMUSCULAR | Status: AC
  Filled 2019-09-02: qty 1

## 2019-09-02 MED ORDER — APIXABAN 5 MG PO TABS: 5.0000 mg | ORAL_TABLET | Freq: Two times a day (BID) | ORAL | 0 refills | Status: DC

## 2019-09-02 MED ORDER — SODIUM CHLORIDE 0.9 % IV SOLN
500.0000 mg/m2 | Freq: Once | INTRAVENOUS | Status: AC
  Administered 2019-09-02: 880 mg via INTRAVENOUS
  Filled 2019-09-02: qty 44

## 2019-09-02 MED ORDER — SODIUM CHLORIDE 0.9 % IV SOLN
65.0000 mg/m2 | Freq: Once | INTRAVENOUS | Status: AC
  Administered 2019-09-02: 120 mg via INTRAVENOUS
  Filled 2019-09-02: qty 12

## 2019-09-02 NOTE — Progress Notes (Signed)
Met with patient in treatment area to introduce myself as Arboriculturist and to offer available resources.  Discussed one-time $1000 Radio broadcast assistant to assist with personal expenses while going through treatment. Also asked about insurance ded/OOP, patient states she has met everything and plan renews in January but existing treatment would be covered without starting over. Advised there may be copay assistance available for specific treatment drugs if the insurance leaves her with a balance. She verbalized understanding and was very Patent attorney.   Gave her my card if interested in applying and for any additional financial questions or concerns.

## 2019-09-02 NOTE — Patient Instructions (Signed)
[facility] Discharge Instructions for Patients Receiving Chemotherapy  Today you received the following chemotherapy agents Taxotere and cytoxan  To help prevent nausea and vomiting after your treatment, we encourage you to take your nausea medication as directed   If you develop nausea and vomiting that is not controlled by your nausea medication, call the clinic.   BELOW ARE SYMPTOMS THAT SHOULD BE REPORTED IMMEDIATELY:  *FEVER GREATER THAN 100.5 F  *CHILLS WITH OR WITHOUT FEVER  NAUSEA AND VOMITING THAT IS NOT CONTROLLED WITH YOUR NAUSEA MEDICATION  *UNUSUAL SHORTNESS OF BREATH  *UNUSUAL BRUISING OR BLEEDING  TENDERNESS IN MOUTH AND THROAT WITH OR WITHOUT PRESENCE OF ULCERS  *URINARY PROBLEMS  *BOWEL PROBLEMS  UNUSUAL RASH Items with * indicate a potential emergency and should be followed up as soon as possible.  Feel free to call the clinic should you have any questions or concerns. The clinic phone number is [phone].  Please show the [place] at check-in to the Emergency Department and triage nurse.

## 2019-09-02 NOTE — Telephone Encounter (Signed)
Added injection appointments 10/19, 11/9 and 12/1 per 10/16 schedule message. New schedule taking to patient in infusion by scheduling.

## 2019-09-05 ENCOUNTER — Other Ambulatory Visit: Payer: Self-pay

## 2019-09-05 ENCOUNTER — Inpatient Hospital Stay: Payer: BC Managed Care – PPO

## 2019-09-05 ENCOUNTER — Telehealth: Payer: Self-pay | Admitting: Hematology

## 2019-09-05 VITALS — BP 101/82 | HR 99 | Temp 98.5°F | Resp 16

## 2019-09-05 DIAGNOSIS — Z170 Estrogen receptor positive status [ER+]: Secondary | ICD-10-CM | POA: Diagnosis not present

## 2019-09-05 DIAGNOSIS — R5383 Other fatigue: Secondary | ICD-10-CM | POA: Diagnosis not present

## 2019-09-05 DIAGNOSIS — Z5189 Encounter for other specified aftercare: Secondary | ICD-10-CM | POA: Diagnosis not present

## 2019-09-05 DIAGNOSIS — Z5111 Encounter for antineoplastic chemotherapy: Secondary | ICD-10-CM | POA: Diagnosis not present

## 2019-09-05 DIAGNOSIS — R42 Dizziness and giddiness: Secondary | ICD-10-CM | POA: Diagnosis not present

## 2019-09-05 DIAGNOSIS — R531 Weakness: Secondary | ICD-10-CM | POA: Diagnosis not present

## 2019-09-05 DIAGNOSIS — C50812 Malignant neoplasm of overlapping sites of left female breast: Secondary | ICD-10-CM

## 2019-09-05 DIAGNOSIS — Z803 Family history of malignant neoplasm of breast: Secondary | ICD-10-CM | POA: Diagnosis not present

## 2019-09-05 DIAGNOSIS — D709 Neutropenia, unspecified: Secondary | ICD-10-CM | POA: Diagnosis not present

## 2019-09-05 DIAGNOSIS — R5081 Fever presenting with conditions classified elsewhere: Secondary | ICD-10-CM | POA: Diagnosis not present

## 2019-09-05 DIAGNOSIS — Z79899 Other long term (current) drug therapy: Secondary | ICD-10-CM | POA: Diagnosis not present

## 2019-09-05 DIAGNOSIS — K5900 Constipation, unspecified: Secondary | ICD-10-CM | POA: Diagnosis not present

## 2019-09-05 MED ORDER — PEGFILGRASTIM-JMDB 6 MG/0.6ML ~~LOC~~ SOSY
PREFILLED_SYRINGE | SUBCUTANEOUS | Status: AC
  Filled 2019-09-05: qty 0.6

## 2019-09-05 MED ORDER — PEGFILGRASTIM-JMDB 6 MG/0.6ML ~~LOC~~ SOSY
6.0000 mg | PREFILLED_SYRINGE | Freq: Once | SUBCUTANEOUS | Status: AC
  Administered 2019-09-05: 6 mg via SUBCUTANEOUS

## 2019-09-05 NOTE — Patient Instructions (Signed)

## 2019-09-05 NOTE — Telephone Encounter (Signed)
No los per 10/16.

## 2019-09-06 ENCOUNTER — Other Ambulatory Visit: Payer: Self-pay

## 2019-09-06 ENCOUNTER — Inpatient Hospital Stay: Payer: BC Managed Care – PPO

## 2019-09-06 ENCOUNTER — Other Ambulatory Visit: Payer: Self-pay | Admitting: Emergency Medicine

## 2019-09-06 ENCOUNTER — Telehealth: Payer: Self-pay

## 2019-09-06 ENCOUNTER — Other Ambulatory Visit: Payer: Self-pay | Admitting: Hematology

## 2019-09-06 ENCOUNTER — Inpatient Hospital Stay (HOSPITAL_BASED_OUTPATIENT_CLINIC_OR_DEPARTMENT_OTHER): Payer: BC Managed Care – PPO | Admitting: Medical

## 2019-09-06 VITALS — BP 112/80 | HR 120 | Temp 99.4°F | Resp 18 | Ht 66.0 in | Wt 151.6 lb

## 2019-09-06 DIAGNOSIS — Z170 Estrogen receptor positive status [ER+]: Secondary | ICD-10-CM | POA: Diagnosis not present

## 2019-09-06 DIAGNOSIS — R509 Fever, unspecified: Secondary | ICD-10-CM

## 2019-09-06 DIAGNOSIS — Z5111 Encounter for antineoplastic chemotherapy: Secondary | ICD-10-CM | POA: Diagnosis not present

## 2019-09-06 DIAGNOSIS — Z5189 Encounter for other specified aftercare: Secondary | ICD-10-CM | POA: Diagnosis not present

## 2019-09-06 DIAGNOSIS — D709 Neutropenia, unspecified: Secondary | ICD-10-CM | POA: Diagnosis not present

## 2019-09-06 DIAGNOSIS — C50812 Malignant neoplasm of overlapping sites of left female breast: Secondary | ICD-10-CM

## 2019-09-06 DIAGNOSIS — R5081 Fever presenting with conditions classified elsewhere: Secondary | ICD-10-CM | POA: Diagnosis not present

## 2019-09-06 DIAGNOSIS — K5900 Constipation, unspecified: Secondary | ICD-10-CM | POA: Diagnosis not present

## 2019-09-06 DIAGNOSIS — Z803 Family history of malignant neoplasm of breast: Secondary | ICD-10-CM | POA: Diagnosis not present

## 2019-09-06 DIAGNOSIS — R42 Dizziness and giddiness: Secondary | ICD-10-CM

## 2019-09-06 DIAGNOSIS — R5383 Other fatigue: Secondary | ICD-10-CM | POA: Diagnosis not present

## 2019-09-06 DIAGNOSIS — Z79899 Other long term (current) drug therapy: Secondary | ICD-10-CM | POA: Diagnosis not present

## 2019-09-06 DIAGNOSIS — R531 Weakness: Secondary | ICD-10-CM | POA: Diagnosis not present

## 2019-09-06 LAB — URINALYSIS, COMPLETE (UACMP) WITH MICROSCOPIC
Bacteria, UA: NONE SEEN
Bilirubin Urine: NEGATIVE
Glucose, UA: NEGATIVE mg/dL
Hgb urine dipstick: NEGATIVE
Ketones, ur: NEGATIVE mg/dL
Nitrite: NEGATIVE
Protein, ur: NEGATIVE mg/dL
Specific Gravity, Urine: 1.016 (ref 1.005–1.030)
pH: 5 (ref 5.0–8.0)

## 2019-09-06 LAB — CBC WITH DIFFERENTIAL (CANCER CENTER ONLY)
Abs Immature Granulocytes: 0.56 10*3/uL — ABNORMAL HIGH (ref 0.00–0.07)
Basophils Absolute: 0.1 10*3/uL (ref 0.0–0.1)
Basophils Relative: 0 %
Eosinophils Absolute: 0 10*3/uL (ref 0.0–0.5)
Eosinophils Relative: 0 %
HCT: 33.1 % — ABNORMAL LOW (ref 36.0–46.0)
Hemoglobin: 11.2 g/dL — ABNORMAL LOW (ref 12.0–15.0)
Immature Granulocytes: 3 %
Lymphocytes Relative: 3 %
Lymphs Abs: 0.6 10*3/uL — ABNORMAL LOW (ref 0.7–4.0)
MCH: 30.6 pg (ref 26.0–34.0)
MCHC: 33.8 g/dL (ref 30.0–36.0)
MCV: 90.4 fL (ref 80.0–100.0)
Monocytes Absolute: 0.1 10*3/uL (ref 0.1–1.0)
Monocytes Relative: 1 %
Neutro Abs: 17.2 10*3/uL — ABNORMAL HIGH (ref 1.7–7.7)
Neutrophils Relative %: 93 %
Platelet Count: 324 10*3/uL (ref 150–400)
RBC: 3.66 MIL/uL — ABNORMAL LOW (ref 3.87–5.11)
RDW: 14.1 % (ref 11.5–15.5)
WBC Count: 18.6 10*3/uL — ABNORMAL HIGH (ref 4.0–10.5)
nRBC: 0 % (ref 0.0–0.2)

## 2019-09-06 LAB — CMP (CANCER CENTER ONLY)
ALT: 41 U/L (ref 0–44)
AST: 17 U/L (ref 15–41)
Albumin: 3.8 g/dL (ref 3.5–5.0)
Alkaline Phosphatase: 118 U/L (ref 38–126)
Anion gap: 13 (ref 5–15)
BUN: 14 mg/dL (ref 6–20)
CO2: 24 mmol/L (ref 22–32)
Calcium: 9.1 mg/dL (ref 8.9–10.3)
Chloride: 104 mmol/L (ref 98–111)
Creatinine: 0.83 mg/dL (ref 0.44–1.00)
GFR, Est AFR Am: >60 mL/min (ref >60)
GFR, Estimated: >60 mL/min (ref >60)
Glucose, Bld: 142 mg/dL — ABNORMAL HIGH (ref 70–99)
Potassium: 4.1 mmol/L (ref 3.5–5.1)
Sodium: 141 mmol/L (ref 135–145)
Total Bilirubin: 0.5 mg/dL (ref 0.3–1.2)
Total Protein: 6.6 g/dL (ref 6.5–8.1)

## 2019-09-06 MED ORDER — SODIUM CHLORIDE 0.9 % IV SOLN
INTRAVENOUS | Status: AC
  Administered 2019-09-06: 16:00:00 via INTRAVENOUS
  Filled 2019-09-06 (×2): qty 250

## 2019-09-06 NOTE — Telephone Encounter (Signed)
Called patient let her know that Dr. Burr Medico would like her to come in and be evaluated by Megan Kent in the [facility]. Patient was agreeable and said she would get changed and head over to the clinic shortly. Leland Johns aware and assisting with placing lab appointment

## 2019-09-06 NOTE — Patient Instructions (Signed)

## 2019-09-06 NOTE — Telephone Encounter (Signed)
-----   Message from Truitt Merle, MD sent at 09/06/2019  1:27 PM EDT ----- Althia Forts,  She is having fever 100.4 at home now, last chemo on 10/15. Please get her in ASAP to see Lucianne Lei or Regan Rakers if Lucianne Lei is not available, lab and flush first, I will order blood and urine cultures  Thanks   Krista Blue

## 2019-09-06 NOTE — Progress Notes (Signed)
PA Lucianne Lei aware of all VS, including orthostatics.  Pt received 1L IVF, tolerated well.  VSS.  Reports feeling better at end of tx.  Pt will be contacted with results of urine sample and is aware to f/u as needed.  Denies questions or concerns at time of d/c.

## 2019-09-07 ENCOUNTER — Other Ambulatory Visit: Payer: Self-pay | Admitting: Medical

## 2019-09-07 DIAGNOSIS — I951 Orthostatic hypotension: Secondary | ICD-10-CM | POA: Insufficient documentation

## 2019-09-07 MED ORDER — LEVOFLOXACIN 750 MG PO TABS: 750.0000 mg | ORAL_TABLET | Freq: Every day | ORAL | Status: DC

## 2019-09-07 NOTE — Progress Notes (Signed)
Sympt[facility] Management Clinic Progress Note   Megan Kent [id number] 12/02/1965 53 y.o.  Megan Kent is managed by    Actively treated with chemotherapy/immunotherapy/hormonal therapy: yes  Current therapy: Cytoxan and Taxotere with Fulphila support.  Last treated: 09/02/2019 (cycle 2, day 1)  Next scheduled appointment with provider: 09/23/2019  Assessment: Plan:    Fever, unspecified fever cause  Malignant neoplasm of overlapping sites of left breast in female, estrogen receptor positive ([place])  Dizziness - Plan: 0.9 %  sodium chloride infusion   Fever: Megan Kent called earlier today saying that she had a fever of 100.4.  Of note she had a hospitalization for neutropenic fever following the first cycle of chemotherapy.  Her CBC returned today with a WBC of 18.6 and an ANC of 17.2.  Blood cultures x2, urinalysis, chemistry panel, and urine cultures were collected today.  Patient's urinalysis does not show evidence of a recurrent UTI at this point.  These results were reviewed with Dr. Burr Medico who recommended waiting until the results of her blood culture and urine cultures began to return prior to initiating antibiotics.  Positional dizziness: The patient was given 1 L of normal saline IV today.  ER positive malignant neoplasm of the left breast: The patient continues to be managed by Dr. Truitt Merle and is status post cycle 2, day 1 of Cytoxan and Taxotere with Fulphila support which was dosed on 09/02/2019.  She is scheduled to be seen in follow-up on 09/23/2019.  Please see After Visit Summary for patient specific instructions.  Future Appointments  Date Time Provider [place]  09/23/2019  8:00 AM [facility] LAB 1 [facility] None  09/23/2019  8:40 AM Truitt Merle, MD [facility] None  09/23/2019  9:00 AM [facility] INFUSION [facility] None  09/26/2019  2:15 PM [facility] [place] FLUSH [facility] None  10/17/2019  8:00 AM [facility] LAB 5 [facility] None  10/17/2019   8:40 AM Truitt Merle, MD [facility] None  10/17/2019  9:30 AM [facility] INFUSION [facility] None  10/18/2019  2:15 PM [facility] [place] FLUSH [facility] None    No orders of the defined types were placed in this encounter.      Subjective:   Patient ID:  Megan Kent is a 53 y.o. (DOB [birth year]) female.  Chief Complaint:  Chief Complaint  Patient presents with  . Fever    HPI Megan Kent Is a 53 y.o. female with a diagnosis of an ER positive malignant neoplasm of the left breast. She continues to be managed by Dr. Truitt Merle and is status post cycle 2, day 1 of Cytoxan and Taxotere with Fulphila support which was dosed on 09/02/2019.  She contacted her office earlier this morning stating that she had a temperature of 100.4.  She was brought in for an exam and for labs.  Blood cultures x2, urinalysis, urine culture, CBC, and chemistry panel were collected today.  She was hospitalized for neutropenic fever following her first cycle of chemotherapy.  She reports fatigue, weakness, positional dizziness, and constipation today.  She denies nausea, vomiting, diarrhea, dysuria, cough, or other symptoms consistent with an infection.  No one else at home has been ill.  Medications: I have reviewed the patient's current medications.  Allergies: No Known Allergies  Past Medical History:  Diagnosis Date  . Cancer ([place])     No past surgical history on file.  Family History  Problem Relation Age of Onset  . Cancer Other        breast  cancer in [place]'s sister  . Cancer Other        breast cancer in great [place]     Social History   Socioeconomic History  . Marital status: Married    Spouse name: Not on file  . Number of children: 2  . Years of education: Not on file  . Highest education level: Not on file  Occupational History  . Not on file  Social Needs  . Financial resource strain: Not on file  . Food insecurity    Worry: Not on file    Inability: Not on file  . Transportation  needs    Medical: Not on file    Non-medical: Not on file  Tobacco Use  . Smoking status: Never Smoker  . Smokeless tobacco: Never Used  Substance and Sexual Activity  . Alcohol use: Never    Frequency: Never  . Drug use: Never  . Sexual activity: Not on file  Lifestyle  . Physical activity    Days per week: Not on file    Minutes per session: Not on file  . Stress: Not on file  Relationships  . Social Herbalist on phone: Not on file    Gets together: Not on file    Attends religious service: Not on file    Active member of club or organization: Not on file    Attends meetings of clubs or organizations: Not on file    Relationship status: Not on file  . Intimate partner violence    Fear of current or ex partner: Not on file    Emotionally abused: Not on file    Physically abused: Not on file    Forced sexual activity: Not on file  Other Topics Concern  . Not on file  Social History Narrative  . Not on file    Past Medical History, Surgical history, Social history, and Family history were reviewed and updated as appropriate.   Please see review of systems for further details on the patient's review from today.   Review of Systems:  Review of Systems  Constitutional: Positive for fatigue and fever. Negative for appetite change, chills and diaphoresis.  HENT: Negative for dental problem, mouth sores, rhinorrhea, sinus pressure, sinus pain, sneezing, sore throat and trouble swallowing.   Respiratory: Negative for cough, chest tightness and shortness of breath.   Cardiovascular: Negative for chest pain and palpitations.  Gastrointestinal: Positive for constipation. Negative for diarrhea, nausea and vomiting.  Genitourinary: Negative for dysuria.  Neurological: Positive for dizziness and weakness. Negative for syncope and headaches.    Objective:   Physical Exam:  BP 112/80 (BP Location: Kent Arm, Patient Position: Standing)   Pulse (!) 120   Temp 99.4 F  (37.4 C) (Oral)   Resp 18   Ht 5\' 6"  (1.676 m)   Wt 151 lb 9.6 oz (68.8 kg)   LMP  (LMP Unknown)   SpO2 100%   BMI 24.47 kg/m  ECOG: 0  Physical Exam Constitutional:      General: She is not in acute distress.    Appearance: She is not diaphoretic.  HENT:     Head: Normocephalic and atraumatic.     Kent Ear: Tympanic membrane, ear canal and external ear normal.     Left Ear: Tympanic membrane, ear canal and external ear normal.     Mouth/Throat:     Mouth: Mucous membranes are moist.     Pharynx: Oropharynx is clear. No oropharyngeal exudate  or posterior oropharyngeal erythema.  Eyes:     General: No scleral icterus.       Kent eye: No discharge.        Left eye: No discharge.     Conjunctiva/sclera: Conjunctivae normal.  Cardiovascular:     Rate and Rhythm: Normal rate and regular rhythm.     Heart sounds: Normal heart sounds. No murmur. No friction rub. No gallop.   Pulmonary:     Effort: Pulmonary effort is normal. No respiratory distress.     Breath sounds: Normal breath sounds. No wheezing or rales.  Abdominal:     General: Abdomen is flat. Bowel sounds are normal. There is no distension.     Palpations: Abdomen is soft.     Tenderness: There is no abdominal tenderness. There is no guarding.  Skin:    General: Skin is warm and dry.     Findings: Erythema (The patient's cheeks are mildly erythematous.) present. No rash.  Neurological:     Mental Status: She is alert.     Coordination: Coordination normal.     Gait: Gait normal.  Psychiatric:        Mood and Affect: Mood normal.        Behavior: Behavior normal.        Thought Content: Thought content normal.        Judgment: Judgment normal.     Lab Review:     Component Value Date/Time   NA 141 09/06/2019 1348   K 4.1 09/06/2019 1348   CL 104 09/06/2019 1348   CO2 24 09/06/2019 1348   GLUCOSE 142 (H) 09/06/2019 1348   BUN 14 09/06/2019 1348   CREATININE 0.83 09/06/2019 1348   CALCIUM 9.1 09/06/2019  1348   PROT 6.6 09/06/2019 1348   ALBUMIN 3.8 09/06/2019 1348   AST 17 09/06/2019 1348   ALT 41 09/06/2019 1348   ALKPHOS 118 09/06/2019 1348   BILITOT 0.5 09/06/2019 1348   GFRNONAA >60 09/06/2019 1348   GFRAA >60 09/06/2019 1348       Component Value Date/Time   WBC 18.6 (H) 09/06/2019 1348   WBC 41.6 (H) 08/21/2019 0431   RBC 3.66 (L) 09/06/2019 1348   HGB 11.2 (L) 09/06/2019 1348   HCT 33.1 (L) 09/06/2019 1348   PLT 324 09/06/2019 1348   MCV 90.4 09/06/2019 1348   MCH 30.6 09/06/2019 1348   MCHC 33.8 09/06/2019 1348   RDW 14.1 09/06/2019 1348   LYMPHSABS 0.6 (L) 09/06/2019 1348   MONOABS 0.1 09/06/2019 1348   EOSABS 0.0 09/06/2019 1348   BASOSABS 0.1 09/06/2019 1348   -------------------------------  Imaging from last 24 hours (if applicable):  Radiology interpretation: Dg Chest Port 1 View  Result Date: 08/19/2019 CLINICAL DATA:  53 year old female with neutropenic fever. Undergoing chemotherapy for breast cancer. EXAM: PORTABLE CHEST 1 VIEW COMPARISON:  None. FINDINGS: Portable AP semi upright view at 0021 hours. Postoperative changes to the left chest wall with tissue expander in place. Normal lung volumes and mediastinal contours. Allowing for portable technique the lungs are clear. Visualized tracheal air column is within normal limits. Chronic mid left clavicle fracture. No acute osseous abnormality identified. Paucity of bowel gas in the visible upper abdomen. IMPRESSION: No cardiopulmonary abnormality. Electronically Signed   By: Genevie Ann M.D.   On: 08/19/2019 01:11        This case was discussed with Dr. Burr Medico. She expressed agreement with my management of this patient.

## 2019-09-08 LAB — URINE CULTURE: Culture: 20000 — AB

## 2019-09-11 LAB — CULTURE, BLOOD (SINGLE)
Culture: NO GROWTH
Culture: NO GROWTH
Special Requests: ADEQUATE
Special Requests: ADEQUATE

## 2019-09-12 ENCOUNTER — Encounter: Payer: Self-pay | Admitting: Hematology

## 2019-09-14 ENCOUNTER — Telehealth: Payer: Self-pay

## 2019-09-14 ENCOUNTER — Telehealth: Payer: Self-pay | Admitting: Hematology

## 2019-09-14 ENCOUNTER — Telehealth: Payer: Self-pay | Admitting: *Deleted

## 2019-09-14 NOTE — Telephone Encounter (Signed)
R/s apt per 10/28 sch message - pt aware of appt date and time

## 2019-09-14 NOTE — Telephone Encounter (Signed)
Patient calls stating that she wants to switch her appointments from 11/6 to 11/9 because [birth year] her [birth year].  A scheduling message has been sent.

## 2019-09-14 NOTE — Telephone Encounter (Signed)
Records faxed to [facility] - att Shelby Dubin - release [id number]

## 2019-09-23 ENCOUNTER — Other Ambulatory Visit: Payer: BC Managed Care – PPO

## 2019-09-23 ENCOUNTER — Ambulatory Visit: Payer: BC Managed Care – PPO | Admitting: Hematology

## 2019-09-23 ENCOUNTER — Ambulatory Visit: Payer: BC Managed Care – PPO
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
## 2019-09-26 ENCOUNTER — Inpatient Hospital Stay: Payer: BC Managed Care – PPO | Attending: Hematology

## 2019-09-26 ENCOUNTER — Telehealth: Payer: Self-pay | Admitting: Hematology

## 2019-09-26 ENCOUNTER — Inpatient Hospital Stay: Payer: BC Managed Care – PPO

## 2019-09-26 ENCOUNTER — Encounter: Payer: Self-pay | Admitting: Hematology

## 2019-09-26 ENCOUNTER — Ambulatory Visit: Payer: BC Managed Care – PPO

## 2019-09-26 ENCOUNTER — Inpatient Hospital Stay (HOSPITAL_BASED_OUTPATIENT_CLINIC_OR_DEPARTMENT_OTHER): Payer: BC Managed Care – PPO | Admitting: Hematology

## 2019-09-26 ENCOUNTER — Other Ambulatory Visit: Payer: Self-pay

## 2019-09-26 VITALS — BP 118/77 | HR 80 | Temp 98.3°F | Resp 18 | Ht 66.0 in | Wt 153.3 lb

## 2019-09-26 DIAGNOSIS — Z5189 Encounter for other specified aftercare: Secondary | ICD-10-CM | POA: Diagnosis not present

## 2019-09-26 DIAGNOSIS — Z5111 Encounter for antineoplastic chemotherapy: Secondary | ICD-10-CM | POA: Insufficient documentation

## 2019-09-26 DIAGNOSIS — C50812 Malignant neoplasm of overlapping sites of left female breast: Secondary | ICD-10-CM | POA: Diagnosis not present

## 2019-09-26 DIAGNOSIS — Z170 Estrogen receptor positive status [ER+]: Secondary | ICD-10-CM

## 2019-09-26 LAB — CBC WITH DIFFERENTIAL (CANCER CENTER ONLY)
Abs Immature Granulocytes: 0.01 10*3/uL (ref 0.00–0.07)
Basophils Absolute: 0 10*3/uL (ref 0.0–0.1)
Basophils Relative: 1 %
Eosinophils Absolute: 0 10*3/uL (ref 0.0–0.5)
Eosinophils Relative: 0 %
HCT: 32.5 % — ABNORMAL LOW (ref 36.0–46.0)
Hemoglobin: 10.6 g/dL — ABNORMAL LOW (ref 12.0–15.0)
Immature Granulocytes: 0 %
Lymphocytes Relative: 25 %
Lymphs Abs: 1.2 10*3/uL (ref 0.7–4.0)
MCH: 29.9 pg (ref 26.0–34.0)
MCHC: 32.6 g/dL (ref 30.0–36.0)
MCV: 91.8 fL (ref 80.0–100.0)
Monocytes Absolute: 0.6 10*3/uL (ref 0.1–1.0)
Monocytes Relative: 12 %
Neutro Abs: 3.1 10*3/uL (ref 1.7–7.7)
Neutrophils Relative %: 62 %
Platelet Count: 281 10*3/uL (ref 150–400)
RBC: 3.54 MIL/uL — ABNORMAL LOW (ref 3.87–5.11)
RDW: 15.9 % — ABNORMAL HIGH (ref 11.5–15.5)
WBC Count: 4.9 10*3/uL (ref 4.0–10.5)
nRBC: 0 % (ref 0.0–0.2)

## 2019-09-26 LAB — CMP (CANCER CENTER ONLY)
ALT: 52 U/L — ABNORMAL HIGH (ref 0–44)
AST: 25 U/L (ref 15–41)
Albumin: 3.9 g/dL (ref 3.5–5.0)
Alkaline Phosphatase: 100 U/L (ref 38–126)
Anion gap: 12 (ref 5–15)
BUN: 14 mg/dL (ref 6–20)
CO2: 23 mmol/L (ref 22–32)
Calcium: 9.1 mg/dL (ref 8.9–10.3)
Chloride: 107 mmol/L (ref 98–111)
Creatinine: 0.85 mg/dL (ref 0.44–1.00)
GFR, Est AFR Am: >60 mL/min (ref >60)
GFR, Estimated: >60 mL/min (ref >60)
Glucose, Bld: 137 mg/dL — ABNORMAL HIGH (ref 70–99)
Potassium: 3.5 mmol/L (ref 3.5–5.1)
Sodium: 142 mmol/L (ref 135–145)
Total Bilirubin: 0.3 mg/dL (ref 0.3–1.2)
Total Protein: 6.5 g/dL (ref 6.5–8.1)

## 2019-09-26 MED ORDER — DEXAMETHASONE SODIUM PHOSPHATE 10 MG/ML IJ SOLN
INTRAMUSCULAR | Status: AC
  Filled 2019-09-26: qty 1

## 2019-09-26 MED ORDER — CIPROFLOXACIN HCL 500 MG PO TABS: 500.0000 mg | ORAL_TABLET | Freq: Two times a day (BID) | ORAL | 0 refills | Status: DC

## 2019-09-26 MED ORDER — SODIUM CHLORIDE 0.9 % IV SOLN
Freq: Once | INTRAVENOUS | Status: AC
  Administered 2019-09-26: 10:00:00 via INTRAVENOUS
  Filled 2019-09-26: qty 250

## 2019-09-26 MED ORDER — PALONOSETRON HCL INJECTION 0.25 MG/5ML
INTRAVENOUS | Status: AC
  Filled 2019-09-26: qty 5

## 2019-09-26 MED ORDER — DEXAMETHASONE SODIUM PHOSPHATE 10 MG/ML IJ SOLN
10.0000 mg | Freq: Once | INTRAMUSCULAR | Status: AC
  Administered 2019-09-26: 10 mg via INTRAVENOUS

## 2019-09-26 MED ORDER — LORAZEPAM 0.5 MG PO TABS: 0.5000 mg | ORAL_TABLET | Freq: Every evening | ORAL | 0 refills | Status: DC | PRN

## 2019-09-26 MED ORDER — SODIUM CHLORIDE 0.9 % IV SOLN
65.0000 mg/m2 | Freq: Once | INTRAVENOUS | Status: AC
  Administered 2019-09-26: 120 mg via INTRAVENOUS
  Filled 2019-09-26: qty 12

## 2019-09-26 MED ORDER — PALONOSETRON HCL INJECTION 0.25 MG/5ML
0.2500 mg | Freq: Once | INTRAVENOUS | Status: AC
  Administered 2019-09-26: 0.25 mg via INTRAVENOUS

## 2019-09-26 MED ORDER — SODIUM CHLORIDE 0.9 % IV SOLN
500.0000 mg/m2 | Freq: Once | INTRAVENOUS | Status: AC
  Administered 2019-09-26: 880 mg via INTRAVENOUS
  Filled 2019-09-26: qty 44

## 2019-09-26 NOTE — Telephone Encounter (Signed)
Per 11/9 los Rad/onc referral, see Dr. Isidore Moos in 2-4 weeks

## 2019-09-26 NOTE — Patient Instructions (Signed)
[place] Discharge Instructions for Patients Receiving Chemotherapy  Today you received the following chemotherapy agents Taxotere and cytoxan  To help prevent nausea and vomiting after your treatment, we encourage you to take your nausea medication as directed   If you develop nausea and vomiting that is not controlled by your nausea medication, call the clinic.   BELOW ARE SYMPTOMS THAT SHOULD BE REPORTED IMMEDIATELY:  *FEVER GREATER THAN 100.5 F  *CHILLS WITH OR WITHOUT FEVER  NAUSEA AND VOMITING THAT IS NOT CONTROLLED WITH YOUR NAUSEA MEDICATION  *UNUSUAL SHORTNESS OF BREATH  *UNUSUAL BRUISING OR BLEEDING  TENDERNESS IN MOUTH AND THROAT WITH OR WITHOUT PRESENCE OF ULCERS  *URINARY PROBLEMS  *BOWEL PROBLEMS  UNUSUAL RASH Items with * indicate a potential emergency and should be followed up as soon as possible.  Feel free to call the clinic should you have any questions or concerns. The clinic phone number is [phone].  Please show the [place] at check-in to the Emergency Department and triage nurse.

## 2019-09-27 ENCOUNTER — Telehealth: Payer: Self-pay | Admitting: Radiation Oncology

## 2019-09-27 NOTE — Telephone Encounter (Signed)
New message: ° ° °LVM for patient to return call to schedule appt from referral received. °

## 2019-09-28 ENCOUNTER — Other Ambulatory Visit: Payer: Self-pay

## 2019-09-28 ENCOUNTER — Inpatient Hospital Stay: Payer: BC Managed Care – PPO

## 2019-09-28 VITALS — BP 118/72 | HR 78 | Temp 98.2°F | Resp 18

## 2019-09-28 DIAGNOSIS — C50812 Malignant neoplasm of overlapping sites of left female breast: Secondary | ICD-10-CM | POA: Diagnosis not present

## 2019-09-28 DIAGNOSIS — Z5189 Encounter for other specified aftercare: Secondary | ICD-10-CM | POA: Diagnosis not present

## 2019-09-28 DIAGNOSIS — D229 Melanocytic nevi, unspecified: Secondary | ICD-10-CM | POA: Diagnosis not present

## 2019-09-28 DIAGNOSIS — J3089 Other allergic rhinitis: Secondary | ICD-10-CM | POA: Diagnosis not present

## 2019-09-28 DIAGNOSIS — E7800 Pure hypercholesterolemia, unspecified: Secondary | ICD-10-CM | POA: Diagnosis not present

## 2019-09-28 DIAGNOSIS — F338 Other recurrent depressive disorders: Secondary | ICD-10-CM | POA: Diagnosis not present

## 2019-09-28 DIAGNOSIS — Z5111 Encounter for antineoplastic chemotherapy: Secondary | ICD-10-CM | POA: Diagnosis not present

## 2019-09-28 MED ORDER — PEGFILGRASTIM-JMDB 6 MG/0.6ML ~~LOC~~ SOSY
PREFILLED_SYRINGE | SUBCUTANEOUS | Status: AC
  Filled 2019-09-28: qty 0.6

## 2019-09-28 MED ORDER — PEGFILGRASTIM-JMDB 6 MG/0.6ML ~~LOC~~ SOSY
6.0000 mg | PREFILLED_SYRINGE | Freq: Once | SUBCUTANEOUS | Status: AC
  Administered 2019-09-28: 6 mg via SUBCUTANEOUS

## 2019-09-28 NOTE — Patient Instructions (Signed)

## 2019-09-30 ENCOUNTER — Telehealth: Payer: Self-pay | Admitting: Radiation Oncology

## 2019-09-30 NOTE — Telephone Encounter (Signed)
New message: ° ° °LVM for patient to return call to schedule appt from referral received. °

## 2019-10-05 ENCOUNTER — Telehealth: Payer: Self-pay | Admitting: Radiation Oncology

## 2019-10-05 NOTE — Telephone Encounter (Signed)
New message;   Returned patients call to schedule appt from referral received.
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
## 2019-10-16 DIAGNOSIS — Z86718 Personal history of other venous thrombosis and embolism: Secondary | ICD-10-CM | POA: Insufficient documentation

## 2019-10-17 ENCOUNTER — Inpatient Hospital Stay (HOSPITAL_BASED_OUTPATIENT_CLINIC_OR_DEPARTMENT_OTHER): Payer: BC Managed Care – PPO | Admitting: Hematology

## 2019-10-17 ENCOUNTER — Telehealth: Payer: Self-pay | Admitting: Hematology

## 2019-10-17 ENCOUNTER — Encounter: Payer: Self-pay | Admitting: Hematology

## 2019-10-17 ENCOUNTER — Other Ambulatory Visit: Payer: Self-pay

## 2019-10-17 ENCOUNTER — Inpatient Hospital Stay: Payer: BC Managed Care – PPO

## 2019-10-17 ENCOUNTER — Encounter: Payer: Self-pay | Admitting: *Deleted

## 2019-10-17 VITALS — BP 123/82 | HR 97 | Temp 98.2°F | Resp 18 | Ht 66.0 in | Wt 154.5 lb

## 2019-10-17 DIAGNOSIS — Z5111 Encounter for antineoplastic chemotherapy: Secondary | ICD-10-CM | POA: Diagnosis not present

## 2019-10-17 DIAGNOSIS — Z170 Estrogen receptor positive status [ER+]: Secondary | ICD-10-CM

## 2019-10-17 DIAGNOSIS — Z86718 Personal history of other venous thrombosis and embolism: Secondary | ICD-10-CM

## 2019-10-17 DIAGNOSIS — C50812 Malignant neoplasm of overlapping sites of left female breast: Secondary | ICD-10-CM

## 2019-10-17 DIAGNOSIS — Z5189 Encounter for other specified aftercare: Secondary | ICD-10-CM | POA: Diagnosis not present

## 2019-10-17 LAB — CMP (CANCER CENTER ONLY)
ALT: 92 U/L — ABNORMAL HIGH (ref 0–44)
AST: 45 U/L — ABNORMAL HIGH (ref 15–41)
Albumin: 3.9 g/dL (ref 3.5–5.0)
Alkaline Phosphatase: 101 U/L (ref 38–126)
Anion gap: 11 (ref 5–15)
BUN: 13 mg/dL (ref 6–20)
CO2: 23 mmol/L (ref 22–32)
Calcium: 9 mg/dL (ref 8.9–10.3)
Chloride: 107 mmol/L (ref 98–111)
Creatinine: 0.87 mg/dL (ref 0.44–1.00)
GFR, Est AFR Am: >60 mL/min (ref >60)
GFR, Estimated: >60 mL/min (ref >60)
Glucose, Bld: 157 mg/dL — ABNORMAL HIGH (ref 70–99)
Potassium: 3.5 mmol/L (ref 3.5–5.1)
Sodium: 141 mmol/L (ref 135–145)
Total Bilirubin: 0.3 mg/dL (ref 0.3–1.2)
Total Protein: 6.6 g/dL (ref 6.5–8.1)

## 2019-10-17 LAB — CBC WITH DIFFERENTIAL (CANCER CENTER ONLY)
Abs Immature Granulocytes: 0.03 10*3/uL (ref 0.00–0.07)
Basophils Absolute: 0 10*3/uL (ref 0.0–0.1)
Basophils Relative: 0 %
Eosinophils Absolute: 0 10*3/uL (ref 0.0–0.5)
Eosinophils Relative: 0 %
HCT: 33.4 % — ABNORMAL LOW (ref 36.0–46.0)
Hemoglobin: 10.9 g/dL — ABNORMAL LOW (ref 12.0–15.0)
Immature Granulocytes: 0 %
Lymphocytes Relative: 20 %
Lymphs Abs: 1.4 10*3/uL (ref 0.7–4.0)
MCH: 30.2 pg (ref 26.0–34.0)
MCHC: 32.6 g/dL (ref 30.0–36.0)
MCV: 92.5 fL (ref 80.0–100.0)
Monocytes Absolute: 0.3 10*3/uL (ref 0.1–1.0)
Monocytes Relative: 5 %
Neutro Abs: 5.2 10*3/uL (ref 1.7–7.7)
Neutrophils Relative %: 75 %
Platelet Count: 237 10*3/uL (ref 150–400)
RBC: 3.61 MIL/uL — ABNORMAL LOW (ref 3.87–5.11)
RDW: 16 % — ABNORMAL HIGH (ref 11.5–15.5)
WBC Count: 7 10*3/uL (ref 4.0–10.5)
nRBC: 0 % (ref 0.0–0.2)

## 2019-10-17 MED ORDER — PALONOSETRON HCL INJECTION 0.25 MG/5ML
INTRAVENOUS | Status: AC
  Filled 2019-10-17: qty 5

## 2019-10-17 MED ORDER — SODIUM CHLORIDE 0.9 % IV SOLN
65.0000 mg/m2 | Freq: Once | INTRAVENOUS | Status: AC
  Administered 2019-10-17: 120 mg via INTRAVENOUS
  Filled 2019-10-17: qty 12

## 2019-10-17 MED ORDER — ONDANSETRON HCL 8 MG PO TABS: 8.0000 mg | ORAL_TABLET | Freq: Two times a day (BID) | ORAL | 0 refills | Status: DC

## 2019-10-17 MED ORDER — SODIUM CHLORIDE 0.9 % IV SOLN
Freq: Once | INTRAVENOUS | Status: AC
  Administered 2019-10-17: 10:00:00 via INTRAVENOUS
  Filled 2019-10-17: qty 250

## 2019-10-17 MED ORDER — DEXAMETHASONE SODIUM PHOSPHATE 10 MG/ML IJ SOLN
INTRAMUSCULAR | Status: AC
  Filled 2019-10-17: qty 1

## 2019-10-17 MED ORDER — LEVOFLOXACIN 750 MG PO TABS: 750.0000 mg | ORAL_TABLET | Freq: Every day | ORAL | 0 refills | Status: DC

## 2019-10-17 MED ORDER — SODIUM CHLORIDE 0.9 % IV SOLN
500.0000 mg/m2 | Freq: Once | INTRAVENOUS | Status: AC
  Administered 2019-10-17: 880 mg via INTRAVENOUS
  Filled 2019-10-17: qty 44

## 2019-10-17 MED ORDER — LEVOFLOXACIN 750 MG PO TABS: 750.0000 mg | ORAL_TABLET | Freq: Every day | ORAL | Status: DC

## 2019-10-17 MED ORDER — PALONOSETRON HCL INJECTION 0.25 MG/5ML
0.2500 mg | Freq: Once | INTRAVENOUS | Status: AC
  Administered 2019-10-17: 0.25 mg via INTRAVENOUS

## 2019-10-17 MED ORDER — DEXAMETHASONE SODIUM PHOSPHATE 10 MG/ML IJ SOLN
10.0000 mg | Freq: Once | INTRAMUSCULAR | Status: AC
  Administered 2019-10-17: 10 mg via INTRAVENOUS

## 2019-10-17 NOTE — Progress Notes (Signed)
Per progress note from today's visit, okay to proceed with treatment today.   Patient declined use of headband with Dignicap. RN educated patient on importance of use. Patient verbalized understanding.

## 2019-10-17 NOTE — Patient Instructions (Addendum)
[facility] Discharge Instructions for Patients Receiving Chemotherapy  Today you received the following chemotherapy agents: Taxotere/Cytoxan.  To help prevent nausea and vomiting after your treatment, we encourage you to take your nausea medication as directed.   If you develop nausea and vomiting that is not controlled by your nausea medication, call the clinic.   BELOW ARE SYMPTOMS THAT SHOULD BE REPORTED IMMEDIATELY:  *FEVER GREATER THAN 100.5 F  *CHILLS WITH OR WITHOUT FEVER  NAUSEA AND VOMITING THAT IS NOT CONTROLLED WITH YOUR NAUSEA MEDICATION  *UNUSUAL SHORTNESS OF BREATH  *UNUSUAL BRUISING OR BLEEDING  TENDERNESS IN MOUTH AND THROAT WITH OR WITHOUT PRESENCE OF ULCERS  *URINARY PROBLEMS  *BOWEL PROBLEMS  UNUSUAL RASH Items with * indicate a potential emergency and should be followed up as soon as possible.  Feel free to call the clinic should you have any questions or concerns. The clinic phone number is [phone].  Please show the CHEMO ALERT CARD at check-in to the Emergency Department and triage nurse.   

## 2019-10-17 NOTE — Telephone Encounter (Signed)
Scheduled appt per 11/30 los. ° °Left a vm of the appt date and time. °

## 2019-10-18 ENCOUNTER — Telehealth: Payer: Self-pay

## 2019-10-18 ENCOUNTER — Inpatient Hospital Stay: Payer: BC Managed Care – PPO | Attending: Hematology

## 2019-10-18 ENCOUNTER — Other Ambulatory Visit: Payer: Self-pay

## 2019-10-18 VITALS — BP 128/78 | HR 89 | Temp 98.2°F | Resp 18

## 2019-10-18 DIAGNOSIS — Z170 Estrogen receptor positive status [ER+]: Secondary | ICD-10-CM

## 2019-10-18 DIAGNOSIS — C50812 Malignant neoplasm of overlapping sites of left female breast: Secondary | ICD-10-CM | POA: Diagnosis not present

## 2019-10-18 DIAGNOSIS — Z5189 Encounter for other specified aftercare: Secondary | ICD-10-CM | POA: Diagnosis not present

## 2019-10-18 MED ORDER — PEGFILGRASTIM-JMDB 6 MG/0.6ML ~~LOC~~ SOSY
PREFILLED_SYRINGE | SUBCUTANEOUS | Status: AC
  Filled 2019-10-18: qty 0.6

## 2019-10-18 MED ORDER — PEGFILGRASTIM-JMDB 6 MG/0.6ML ~~LOC~~ SOSY
6.0000 mg | PREFILLED_SYRINGE | Freq: Once | SUBCUTANEOUS | Status: AC
  Administered 2019-10-18: 6 mg via SUBCUTANEOUS

## 2019-10-18 NOTE — Telephone Encounter (Signed)
Faxed Dr. Ernestina Penna office note from 10/17/2019 to Dr. Jackson Latino to fax [phone]

## 2019-10-18 NOTE — Patient Instructions (Signed)

## 2019-10-20 NOTE — Progress Notes (Signed)
Location of Breast Cancer: Left Breast  Histology per Pathology Report:  06/24/19 Diagnosis 1. Consult Slide , Left Breast Mastectomy - INVASIVE DUCTAL CARCINOMA, GRADE II/III, MULTIPLE FOCI, THE LARGEST SPANS 4.2 CM. - DUCTAL CARCINOMA IN SITU, INTERMEDIATE GRADE. - INVASIVE AND IN SITU DUCTAL CARCINOMA ARE FOCALLY LESS THAN 0.1 CM TO THE ANTERIOR MARGIN. - SEE ONCOLOGY TABLE BELOW. 2. Consult Slide , breast/nipple, left, excision - DUCTAL CARCINOMA IN SITU, INTERMEDIATE GRADE. - DUCTAL CARCINOMA IN SITU IS FOCALLY LESS THAN 0.1 CM TO INKED TISSUE EDGE 3. Consult Slide , lymph node, excision, left central , level 1 - METASTATIC CARCINOMA IN 1 OF 1 LYMPH NODE (1/1). - EXTRACAPSULAR EXTENSION IS IDENTIFIED 4. Consult Slide , lymph node, excision, left central level 2 - THERE IS NO EVIDENCE OF CARCINOMA IN 1 OF 1 LYMPH NODE (0/1).  Did patient present with symptoms or was this found on screening mammography?: It was found on a screening mammogram.   Past/Anticipated interventions by surgeon, if any: Dr. Otelia Santee with [facility]  Past/Anticipated interventions by medical oncology, if any:  10/17/19 Dr. Burr Medico ASSESSMENT & PLAN:  Megan Kent is a 53 y.o. 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 -Dr. Littie Deeds secondary surgery to remove left nipple and she will have implant placed at same time. Her surgery has been slightly delayed due to her acute extensive LE DVT. I discussed with Dr. Jackson Latino and we agree to postpone her second surgery to after her chemo.  -Due to her high risk disease, I started her onadjuvant chemo TCq2weeks for 4 cycles on 08/12/19.  Para [place] using Dignicap during infusionswith mild hair loss.  -S/p cycle 3 TC she  tolerated much better with fatigue. She denies fever. Labs reviewed and adequate to proceed with last cycle TC today. Will continue prophylactic levaquin for this cycle.  -I will send message to Dr Jackson Latino about proceeding with surgery. She plans to see Dr. Isidore Moos next week about Radiation therapy after surgery.  -After adjuvant radiation, she will proceed with antiestrogen therapy. She is interested. Her last period was in 11/2018. Will check her FSH/Estradial 3-4 months after chemo.  -I reviewed the effects of chemo on her immune system.  -F/u in 4 weeks   Lymphedema issues, if any:  She denies  Pain issues, if any:  She denies  SAFETY ISSUES:  Prior radiation? No  Pacemaker/ICD? No  Possible current pregnancy? No, Her last period was last February.   Is the patient on methotrexate? No  Current Complaints / other details:      Gregary Blackard, Stephani Police, RN 10/20/2019,12:41 PM

## 2019-10-24 NOTE — Progress Notes (Signed)
Radiation Oncology         [phone] ________________________________  Initial outpatient MyChart Consultation  Name: Megan Kent MRN: [id number]  Date: 10/25/2019  DOB: 1966/04/23  VW:PVXYIA, Lennette Bihari, MD  Truitt Merle, MD   REFERRING PHYSICIAN: Truitt Merle, MD  DIAGNOSIS:    ICD-10-CM   1. Malignant neoplasm of overlapping sites of left breast in female, estrogen receptor positive ([place])  C50.812    Z17.0      Cancer Staging Cancer of overlapping sites of left female breast [facility]) Staging form: Breast, AJCC 8th Edition - Pathologic stage from 06/24/2019: Stage IB (pT2, pN1a, cM0, G2, ER+, PR+, HER2-, Oncotype DX score: 25) - Signed by Truitt Merle, MD on 08/10/2019  CHIEF COMPLAINT: Here to discuss management of left breast cancer  HISTORY OF PRESENT ILLNESS::Megan Kent is a 53 y.o. female who presented with breast abnormality on the following imaging: routine screening mammogram on the date of 04/28/2019.  Symptoms, if any, at that time, were: none.   Ultrasound of breast on 05/12/2019 revealed 1.6 cm lesion in the upper-inner left breast and a 0.8 cm lesion centrally.   Biopsy on date of 05/19/2019 showed invasive ductal carcinoma in both samples.  ER status: positive; PR status positive, Her2 status negative; Grade 1.  Breast MRI performed on 06/06/2019 showed: evidence of multicentric carcinoma in left breast, measuring up to at least 6.5 cm which encompasses the 2 biopsied lesion as well as additional nonbiopsied suspicious masses and nonmass enhancement; no evidence of adenopathy; no significant abnormality in the right breast.  She opted to proceed with left mastectomy with left sentinel lymph node biopsy on 06/24/2019 under Dr. Jackson Latino at [place]. Pathology revealed: multifocal invasive ductal carcinoma (3 foci) measuring 1.1 cm (grade 1), 1.9 cm (grade 2), and 1.1 cm (grade 2); extensive DCIS (intermediate grade); tumor impinges upon and sometimes involves the anterior margin; one of two  lymph nodes positive and with extranodal extension (1/2).  Oncotype DX was obtained on the final surgical sample and the recurrence score of 25 predicts a risk of recurrence outside the breast over the next 9 years of 20%, if the patient's only systemic therapy is an antiestrogen for 5 years.  It also predicts a moderate benefit from chemotherapy.  Dr. Jackson Latino recommended additional surgery to remove the nipple in light of the positive retroareolar margin. They opted to delay this due to development of left lower extremity DVT. She plans to undergo reconstruction at this time.  She was seen by Dr. Marylou Mccoy on 07/26/2019 and was recommended to undergo chemotherapy consisting of dose dense Adriamycin and Cytoxan for 4 cycles followed by weekly taxol for 12 weeks.  She sought out a second opinion with Dr. Burr Medico on 08/03/2019, who recommended docetaxel and Cytoxan for 4 cycles. She agreed to this plan and received chemotherapy from 08/12/2019 through 10/17/2019.  She still anticipates nipple removal now that chemotherapy is complete. 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  She has an expander in right now. Her plastic surgeon is Magda Paganini [place] [phone] and I've spoken with her personally) ([facility]) and she anticipates the ipsilateral implant will be placed at that time. She anticipates contralateral breast augmentation w/ small subpectoral impact to achieve slight augmentation and symmetry.  PREVIOUS RADIATION THERAPY: No  PAST MEDICAL HISTORY:  has a past medical history of Asthma and Cancer ([place]).    PAST SURGICAL HISTORY: Past Surgical History:  Procedure Laterality Date  . MASTECTOMY Left 06/24/2019  with [facility]    FAMILY HISTORY: family history includes Cancer in some other family members.  SOCIAL HISTORY:  reports that she has never smoked. She has never used smokeless tobacco. She reports previous alcohol use. She reports that she does not use drugs.  ALLERGIES:  Patient has no known allergies.  MEDICATIONS:  Current Outpatient Medications  Medication Sig Dispense Refill  . apixaban (ELIQUIS) 5 MG TABS tablet Take 1 tablet (5 mg total) by mouth 2 (two) times daily. 180 tablet 0  . buPROPion (WELLBUTRIN XL) 150 MG 24 hr tablet Take 150 mg by mouth daily.    Marland Kitchen escitalopram (LEXAPRO) 20 MG tablet Take 20 mg by mouth daily.    Marland Kitchen LORazepam (ATIVAN) 0.5 MG tablet Take 1-2 tablets (0.5-1 mg total) by mouth at bedtime as needed for sleep. 25 tablet 0  . dexamethasone (DECADRON) 4 MG tablet Take 1 tablet (4 mg total) by mouth 2 (two) times daily with a meal. 1 tablet (51m) 2 times, morning and afternoon,  the day before chemo; 1 tablet daily x 3 days starting the day after chemo (Patient not taking: Reported on 10/25/2019) 20 tablet 1  . ondansetron (ZOFRAN) 8 MG tablet Take 1 tablet (8 mg total) by mouth 2 (two) times daily. (Patient not taking: Reported on 10/25/2019) 20 tablet 0  . prochlorperazine (COMPAZINE) 10 MG tablet Take 1 tablet (10 mg total) by mouth every 6 (six) hours as needed for nausea or vomiting. (Patient not taking: Reported on 10/25/2019) 30 tablet 0   No current facility-administered medications for this encounter.     REVIEW OF SYSTEMS: As above   Of note the patient has had fatigue related to her chemotherapy.  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  She has been taking time off from work during chemotherapy but would like to return part-time and by video conference.  PHYSICAL EXAM:  vitals were not taken for this visit.       LABORATORY DATA:  Lab Results  Component Value Date   WBC 7.0 10/17/2019   HGB 10.9 (L) 10/17/2019   HCT 33.4 (L) 10/17/2019   MCV 92.5 10/17/2019   PLT 237 10/17/2019   CMP     Component Value Date/Time   NA 141 10/17/2019 0808   K 3.5 10/17/2019 0808   CL 107 10/17/2019 0808   CO2 23 10/17/2019 0808   GLUCOSE 157 (H) 10/17/2019 0808   BUN 13 10/17/2019 0808   CREATININE  0.87 10/17/2019 0808   CALCIUM 9.0 10/17/2019 0808   PROT 6.6 10/17/2019 0808   ALBUMIN 3.9 10/17/2019 0808   AST 45 (H) 10/17/2019 0808   ALT 92 (H) 10/17/2019 0808   ALKPHOS 101 10/17/2019 0808   BILITOT 0.3 10/17/2019 0808   GFRNONAA >60 10/17/2019 0808   GFRAA >60 10/17/2019 0808       RADIOGRAPHY: as above     IMPRESSION/PLAN: Left Breast Cancer s/p mastectomy   It was a pleasure meeting the patient today. We discussed the risks, benefits, and side effects of radiotherapy. I recommend radiotherapy to the left chest wall and lymph nodes to reduce her risk of locoregional recurrence by 2/3.  I'd like to treat the IM nodes as well while maximally sparing her heart. We discussed that radiation would take approximately 6.5-7 weeks to complete and that I would give the patient a few weeks to heal following surgery before starting treatment planning. We spoke about acute effects including skin irritation and fatigue,  as well as subacute wounds that can form in the RT fields, and long term cosmetic issues such as capsular contracture, and lymphedema. We discussed much less common late effects including internal organ injury or irritation. We spoke about the latest technology that is used to minimize the risk of late effects for patients undergoing radiotherapy to the breast or chest wall. No guarantees of treatment were given. The patient is enthusiastic about proceeding with treatment. I look forward to participating in the patient's care.    The patient knows to contact me when she is released by her surgeons for RT planning; I've provided her my contact information.  I spoke with her plastic surgeon today Sandi Mealy MD) about coordinating care.  This encounter was provided by telemedicine platform MyChart Video The patient has given verbal consent for this type of encounter and has been advised to only accept a meeting of this type in a secure network environment. The time spent during  this encounter was over 40 minutes. The attendants for this meeting include Eppie Gibson  and [name].  During the encounter, Eppie Gibson was located at [facility] Radiation Oncology Department.  [name] was located at home.    __________________________________________   Eppie Gibson, MD   This document serves as a record of services personally performed by Eppie Gibson, MD. It was created on her behalf by Wilburn Mylar, a trained medical scribe. The creation of this record is based on the scribe's personal observations and the provider's statements to them. This document has been checked and approved by the attending provider.

## 2019-10-25 ENCOUNTER — Encounter: Payer: Self-pay | Admitting: Radiation Oncology

## 2019-10-25 ENCOUNTER — Ambulatory Visit
Admission: RE | Admit: 2019-10-25 | Discharge: 2019-10-25 | Disposition: A | Discharge status: Home / Self Care | Payer: BC Managed Care – PPO | Source: Ambulatory Visit | Attending: Radiation Oncology | Admitting: Radiation Oncology

## 2019-10-25 ENCOUNTER — Other Ambulatory Visit: Payer: Self-pay

## 2019-10-25 DIAGNOSIS — Z170 Estrogen receptor positive status [ER+]: Secondary | ICD-10-CM | POA: Diagnosis not present

## 2019-10-25 DIAGNOSIS — C773 Secondary and unspecified malignant neoplasm of axilla and upper limb lymph nodes: Secondary | ICD-10-CM | POA: Diagnosis not present

## 2019-10-25 DIAGNOSIS — C50812 Malignant neoplasm of overlapping sites of left female breast: Secondary | ICD-10-CM

## 2019-10-25 DIAGNOSIS — Z9012 Acquired absence of left breast and nipple: Secondary | ICD-10-CM | POA: Diagnosis not present

## 2019-10-25 HISTORY — DX: Unspecified asthma, uncomplicated: J45.909

## 2019-10-26 ENCOUNTER — Encounter: Payer: Self-pay | Admitting: Radiation Oncology

## 2019-11-01 DIAGNOSIS — C50912 Malignant neoplasm of unspecified site of left female breast: Secondary | ICD-10-CM | POA: Diagnosis not present

## 2019-11-01 DIAGNOSIS — Z9012 Acquired absence of left breast and nipple: Secondary | ICD-10-CM | POA: Diagnosis not present

## 2019-11-01 DIAGNOSIS — C773 Secondary and unspecified malignant neoplasm of axilla and upper limb lymph nodes: Secondary | ICD-10-CM | POA: Diagnosis not present

## 2019-11-04 DIAGNOSIS — Z86718 Personal history of other venous thrombosis and embolism: Secondary | ICD-10-CM | POA: Diagnosis not present

## 2019-11-07 ENCOUNTER — Encounter: Payer: Self-pay | Admitting: *Deleted
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
## 2019-11-17 ENCOUNTER — Inpatient Hospital Stay (HOSPITAL_BASED_OUTPATIENT_CLINIC_OR_DEPARTMENT_OTHER): Payer: BC Managed Care – PPO | Admitting: Hematology

## 2019-11-17 ENCOUNTER — Inpatient Hospital Stay: Payer: BC Managed Care – PPO

## 2019-11-17 ENCOUNTER — Other Ambulatory Visit: Payer: Self-pay

## 2019-11-17 VITALS — BP 108/72 | HR 78 | Temp 98.0°F | Resp 18 | Ht 66.0 in | Wt 152.5 lb

## 2019-11-17 DIAGNOSIS — Z5189 Encounter for other specified aftercare: Secondary | ICD-10-CM | POA: Diagnosis not present

## 2019-11-17 DIAGNOSIS — Z170 Estrogen receptor positive status [ER+]: Secondary | ICD-10-CM

## 2019-11-17 DIAGNOSIS — C50812 Malignant neoplasm of overlapping sites of left female breast: Secondary | ICD-10-CM | POA: Diagnosis not present

## 2019-11-17 DIAGNOSIS — Z86718 Personal history of other venous thrombosis and embolism: Secondary | ICD-10-CM | POA: Diagnosis not present

## 2019-11-17 LAB — CMP (CANCER CENTER ONLY)
ALT: 43 U/L (ref 0–44)
AST: 30 U/L (ref 15–41)
Albumin: 4 g/dL (ref 3.5–5.0)
Alkaline Phosphatase: 92 U/L (ref 38–126)
Anion gap: 9 (ref 5–15)
BUN: 9 mg/dL (ref 6–20)
CO2: 28 mmol/L (ref 22–32)
Calcium: 8.8 mg/dL — ABNORMAL LOW (ref 8.9–10.3)
Chloride: 103 mmol/L (ref 98–111)
Creatinine: 0.85 mg/dL (ref 0.44–1.00)
GFR, Est AFR Am: >60 mL/min
GFR, Estimated: >60 mL/min
Glucose, Bld: 102 mg/dL — ABNORMAL HIGH (ref 70–99)
Potassium: 4.2 mmol/L (ref 3.5–5.1)
Sodium: 140 mmol/L (ref 135–145)
Total Bilirubin: 0.5 mg/dL (ref 0.3–1.2)
Total Protein: 6.6 g/dL (ref 6.5–8.1)

## 2019-11-17 LAB — CBC WITH DIFFERENTIAL (CANCER CENTER ONLY)
Abs Immature Granulocytes: 0 10*3/uL (ref 0.00–0.07)
Basophils Absolute: 0 10*3/uL (ref 0.0–0.1)
Basophils Relative: 1 %
Eosinophils Absolute: 0.2 10*3/uL (ref 0.0–0.5)
Eosinophils Relative: 5 %
HCT: 35.3 % — ABNORMAL LOW (ref 36.0–46.0)
Hemoglobin: 11.3 g/dL — ABNORMAL LOW (ref 12.0–15.0)
Immature Granulocytes: 0 %
Lymphocytes Relative: 25 %
Lymphs Abs: 0.8 10*3/uL (ref 0.7–4.0)
MCH: 29.8 pg (ref 26.0–34.0)
MCHC: 32 g/dL (ref 30.0–36.0)
MCV: 93.1 fL (ref 80.0–100.0)
Monocytes Absolute: 0.4 10*3/uL (ref 0.1–1.0)
Monocytes Relative: 13 %
Neutro Abs: 1.7 10*3/uL (ref 1.7–7.7)
Neutrophils Relative %: 56 %
Platelet Count: 233 10*3/uL (ref 150–400)
RBC: 3.79 MIL/uL — ABNORMAL LOW (ref 3.87–5.11)
RDW: 15.3 % (ref 11.5–15.5)
WBC Count: 3 10*3/uL — ABNORMAL LOW (ref 4.0–10.5)
nRBC: 0 % (ref 0.0–0.2)

## 2019-11-18 ENCOUNTER — Encounter: Payer: Self-pay | Admitting: Hematology

## 2019-11-19 DIAGNOSIS — Z01818 Encounter for other preprocedural examination: Secondary | ICD-10-CM | POA: Diagnosis not present

## 2019-11-22 DIAGNOSIS — N651 Disproportion of reconstructed breast: Secondary | ICD-10-CM | POA: Diagnosis not present

## 2019-11-22 DIAGNOSIS — C773 Secondary and unspecified malignant neoplasm of axilla and upper limb lymph nodes: Secondary | ICD-10-CM | POA: Diagnosis not present

## 2019-11-22 DIAGNOSIS — F329 Major depressive disorder, single episode, unspecified: Secondary | ICD-10-CM | POA: Diagnosis not present

## 2019-11-22 DIAGNOSIS — Z9013 Acquired absence of bilateral breasts and nipples: Secondary | ICD-10-CM | POA: Diagnosis not present

## 2019-11-22 DIAGNOSIS — C50912 Malignant neoplasm of unspecified site of left female breast: Secondary | ICD-10-CM | POA: Diagnosis not present

## 2019-11-22 DIAGNOSIS — Z7901 Long term (current) use of anticoagulants: Secondary | ICD-10-CM | POA: Diagnosis not present

## 2019-11-22 DIAGNOSIS — J45909 Unspecified asthma, uncomplicated: Secondary | ICD-10-CM | POA: Diagnosis not present

## 2019-11-22 DIAGNOSIS — F419 Anxiety disorder, unspecified: Secondary | ICD-10-CM | POA: Diagnosis not present

## 2019-11-22 DIAGNOSIS — C50012 Malignant neoplasm of nipple and areola, left female breast: Secondary | ICD-10-CM | POA: Diagnosis not present

## 2019-11-22 DIAGNOSIS — Z86718 Personal history of other venous thrombosis and embolism: Secondary | ICD-10-CM | POA: Diagnosis not present

## 2019-11-22 DIAGNOSIS — Z79899 Other long term (current) drug therapy: Secondary | ICD-10-CM | POA: Diagnosis not present

## 2019-11-22 DIAGNOSIS — Z853 Personal history of malignant neoplasm of breast: Secondary | ICD-10-CM | POA: Diagnosis not present

## 2019-11-22 DIAGNOSIS — Z421 Encounter for breast reconstruction following mastectomy: Secondary | ICD-10-CM | POA: Diagnosis not present

## 2019-11-30 ENCOUNTER — Encounter: Payer: Self-pay | Admitting: *Deleted

## 2019-12-05 ENCOUNTER — Encounter: Payer: Self-pay | Admitting: *Deleted

## 2019-12-12 IMAGING — DX DG CHEST 1V PORT
1 series · 1 of 1 positions shown · non-contrast
Comparison: None.

CLINICAL DATA: 52-year-old female with neutropenic fever.
Undergoing chemotherapy for breast cancer.

EXAM:
PORTABLE CHEST 1 VIEW

[chest ap]
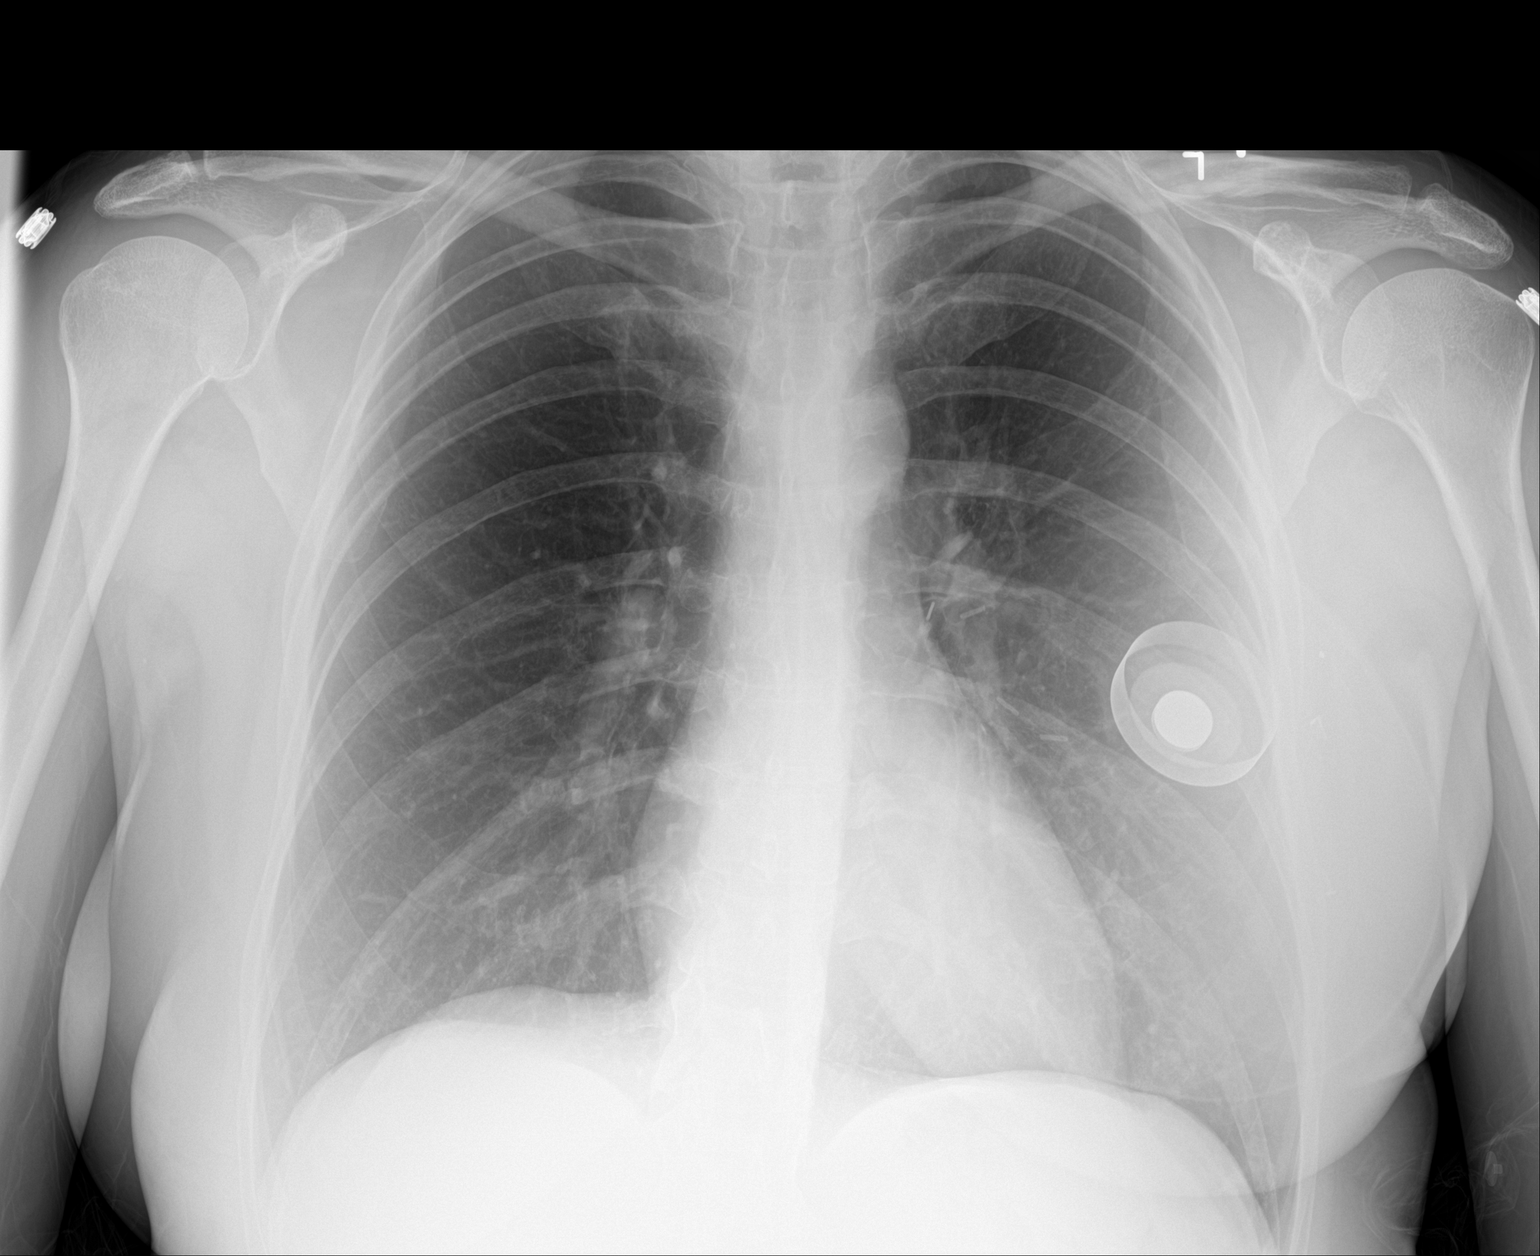

[1 of 1 positions shown; findings below may reference images not displayed]

FINDINGS: Portable AP semi upright view at [id number] hours. Postoperative changes
to the left chest wall with tissue expander in place. Normal lung
volumes and mediastinal contours. Allowing for portable technique
the lungs are clear. Visualized tracheal air column is within normal
limits. Chronic mid left clavicle fracture. No acute osseous
abnormality identified. Paucity of bowel gas in the visible upper
abdomen.
IMPRESSION: No cardiopulmonary abnormality.

## 2019-12-15 ENCOUNTER — Encounter: Payer: Self-pay | Admitting: *Deleted

## 2019-12-22 NOTE — Progress Notes (Signed)
Location of Breast Cancer: Left Breast  Histology per Pathology Report:  06/24/19 Diagnosis 1. Consult Slide , Left Breast Mastectomy - INVASIVE DUCTAL CARCINOMA, GRADE II/III, MULTIPLE FOCI, THE LARGEST SPANS 4.2 CM. - DUCTAL CARCINOMA IN SITU, INTERMEDIATE GRADE. - INVASIVE AND IN SITU DUCTAL CARCINOMA ARE FOCALLY LESS THAN 0.1 CM TO THE ANTERIOR MARGIN. - SEE ONCOLOGY TABLE BELOW. 2. Consult Slide , breast/nipple, left, excision - DUCTAL CARCINOMA IN SITU, INTERMEDIATE GRADE. - DUCTAL CARCINOMA IN SITU IS FOCALLY LESS THAN 0.1 CM TO INKED TISSUE EDGE 3. Consult Slide , lymph node, excision, left central , level 1 - METASTATIC CARCINOMA IN 1 OF 1 LYMPH NODE (1/1). - EXTRACAPSULAR EXTENSION IS IDENTIFIED 4. Consult Slide , lymph node, excision, left central level 2 - THERE IS NO EVIDENCE OF CARCINOMA IN 1 OF 1 LYMPH NODE (0/1).  11/22/19 [facility]- Surgery Final Diagnosis Breast tissue/left nipple areolar complex, excision:  Small focus of residual invasive ductal carcinoma, grade 1.  Tumor measures 0.3 cm (3 mm) in greatest dimension.  Examined surgical margins are free of involvement.  Closest margin is posterior or deep at approximately 6 mm.  Surrounding breast tissue shows reactive changes/fibrosis consistent  with prior surgical procedure ([id number]).      Did patient present with symptoms or was this found on screening mammography?: It was found on a screening mammogram.   Past/Anticipated interventions by surgeon, if any: 12/19/19 Dr. Otelia Santee with [place] (surgery follow up visit)  SURGICAL PROCEDURE:  Resection of residual left native nipple areolar complex (11/22/2019) Left breast tissue expander/implant exchange and right breast implant augmentation for symmetry Sandi Mealy, MD)   SUBJECTIVE: The patient reports an uncomplicated postoperative recovery after the outpatient surgery. Incisional discomfort was initially managed with medication prescribed by  Dr. Harl Bowie, followed by a few days of over-the-counter medications she cannot recall. Her appetite and energy levels are returning to baseline since her chemotherapy was completed. She is back to walking 2-3 miles 3-4 days/week with the help of an app called "[employer] Walks". The patient states that she experienced diarrhea for a few days last week, concurrent with recurrent hair shedding. During this time she did not experience fevers, nor malaise. The diarrhea has almost resolved. She has not yet brought these issues to the attention of her medical oncologist which I encouraged her to do to see if they may be chemotherapy related. She states that she originally only lost hair after her first cycle of chemotherapy, but not after subsequent cycles which she attributes to the use of the cold cap. She has been careful to followup the [employer]igni[employer] recommendations to wash her hair only once per week and avoid hair products.  Otherwise, she reports no paresthesias, balance problems, new headaches, focal bone pain, persistent cough, or unexpected weight loss. She is scheduled for radiation therapy planning at [facility] 12/28/2019, and plans to start hormonal therapy after the completion of radiation therapy. It has almost been one year since her last period, and the patient states that her medical oncologist would be checking labs to determine if she was postmenopausal or not prior to starting endocrine blockade.  The patient met with Dr. Harl Bowie last week, and they are planning left breast fat injections, 3-D tattooing of the left nipple areolar complex, and right mastopexy in the future to achieve breast symmetry after radiation therapy.     Past/Anticipated interventions by medical oncology, if any:  11/18/19 Dr. 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 -Dr. Littie Deeds secondary surgery to remove left nipple  Due to positive margins and she will have reconstruction with implant placed at same time. Her surgery has been slightly delayed due to her acute extensive LE DVT and adjuvant chemo. I discussed with Dr. Jackson Latino and we agree to postpone her second surgery to after her chemo. Her surgery is scheduled for 1/5  -Due to her high risk disease,I she recently completed 4 cycles of  adjuvant TCq2weeks for 4 cycles on 08/12/19-10/17/19.  -She continues to recover well from chemo. Labs reviewed, CBC and CMP WNL except hg 11.3, BG 102, Ca 8.8.  -She plans to proceed with second breast resection and reconstruction surgery on 11/22/19 at [facility]  -She will start radiation a few weeks after surgery with Dr Isidore Moos  -f/u towards end of RT   Lymphedema issues, if any:  She denies  Pain issues, if any:  She denies  SAFETY ISSUES:  Prior radiation? No  Pacemaker/ICD? No  Possible current pregnancy? No, Her last period was last February.   Is the patient on methotrexate? No  Current Complaints / other details:    BP 106/80 (BP Location: Right Arm, Patient Position: Sitting)   Pulse 85   Temp 98 F (36.7 C) (Tympanic)   Resp 20   Wt 154 lb (69.9 kg)   SpO2 100%   BMI 24.86 kg/m

## 2019-12-27 NOTE — Progress Notes (Signed)
Radiation Oncology         [phone] ________________________________  Name: Megan Kent MRN: [id number]  Date: 12/28/2019  DOB: May 23, 1966  Follow-Up Visit Note  Outpatient  CC: Hulan Fess, MD  Truitt Merle, MD  Diagnosis:      ICD-10-CM   1. Malignant neoplasm of overlapping sites of left breast in female, estrogen receptor positive ([place])  C50.812 Pregnancy, urine   Z17.0      Cancer Staging Cancer of overlapping sites of left female breast [facility]) Staging form: Breast, AJCC 8th Edition - Pathologic stage from 06/24/2019: Stage IB (pT2, pN1a, cM0, G2, ER+, PR+, HER2-, Oncotype DX score: 25) - Signed by Truitt Merle, MD on 08/10/2019   CHIEF COMPLAINT: Here to discuss management of left breast cancer  Narrative:  The patient returns today for follow-up to discuss radiation treatment options. She was seen in consultation on 10/25/2019.   She underwent additional surgery to remove the nipple in light of the positive retroareolar margin on date of 11/22/2019 with pathology report showing: 0.3 cm focus of residual invasive ductal carcinoma - grade 1.  Closest margin is posterior or deep at approximately 6 mm.  Breast implants were placed bilaterally, under the right breast and in the left chest wall.  She understands that fat injections to create more symmetry between her breasts will be held from the chest wall until she has had several months to heal from radiotherapy.     Lymphedema issues, if any:  She denies  Pain issues, if any:  She denies  SAFETY ISSUES:  Prior radiation? No  Pacemaker/ICD? No  Possible current pregnancy? No, Her last period was last February.   She underwent a pregnancy test that was negative a month ago.  She knows to go upstairs for lab work today for another pregnancy test.  Is the patient on methotrexate? No          ALLERGIES:  has No Known Allergies.  Meds: Current Outpatient Medications  Medication Sig Dispense Refill  . apixaban  (ELIQUIS) 5 MG TABS tablet Take 1 tablet (5 mg total) by mouth 2 (two) times daily. 180 tablet 0  . buPROPion (WELLBUTRIN XL) 150 MG 24 hr tablet Take 150 mg by mouth daily.    Marland Kitchen escitalopram (LEXAPRO) 20 MG tablet Take 20 mg by mouth daily.    Marland Kitchen dexamethasone (DECADRON) 4 MG tablet Take 1 tablet (4 mg total) by mouth 2 (two) times daily with a meal. 1 tablet (47m) 2 times, morning and afternoon,  the day before chemo; 1 tablet daily x 3 days starting the day after chemo (Patient not taking: Reported on 11/17/2019) 20 tablet 1  . LORazepam (ATIVAN) 0.5 MG tablet Take 1-2 tablets (0.5-1 mg total) by mouth at bedtime as needed for sleep. (Patient not taking: Reported on 11/17/2019) 25 tablet 0  . ondansetron (ZOFRAN) 8 MG tablet Take 1 tablet (8 mg total) by mouth 2 (two) times daily. (Patient not taking: Reported on 11/17/2019) 20 tablet 0  . prochlorperazine (COMPAZINE) 10 MG tablet Take 1 tablet (10 mg total) by mouth every 6 (six) hours as needed for nausea or vomiting. (Patient not taking: Reported on 11/17/2019) 30 tablet 0   No current facility-administered medications for this encounter.    Physical Findings:  weight is 154 lb (69.9 kg). Her tympanic temperature is 98 F (36.7 C). Her blood pressure is 106/80 and her pulse is 85. Her respiration is 20 and oxygen saturation is 100%. .Marland Kitchen  General: Alert and oriented, in no acute distress Psychiatric: Judgment and insight are intact. Affect is appropriate. Breast exam reveals excellent healing at her surgical sites - no sign of local recurrence.  Lab Findings: Lab Results  Component Value Date   WBC 3.0 (L) 11/17/2019   HGB 11.3 (L) 11/17/2019   HCT 35.3 (L) 11/17/2019   MCV 93.1 11/17/2019   PLT 233 11/17/2019    Radiographic Findings: No results found.  Impression/Plan: Left Breast Cancer, s/p mastectomy  We reviewed adjuvant radiotherapy today.  I recommend 6.5 weeks directed at the left chest wall and lymph nodes in order to  reduce the risk of locoregional recurrence by 2/3.  I plan to treat the internal mammary, supraclavicular, and axillary lymph nodes as long as I can satisfactorily spare her heart and lungs. The risks, benefits and side effects of this treatment were discussed in detail.  She understands that radiotherapy is associated with skin irritation and fatigue in the acute setting. Late effects can include cosmetic changes and rare injury to internal organs.  She is enthusiastic about proceeding with treatment. A consent form has been signed and placed in her chart.  Proceed with CT simulation today.  Pregnancy urine test ordered to confirm negative result.   On date of service, in total, I spent 35 minutes on this encounter  _____________________________________   Eppie Gibson, MD   This document serves as a record of services personally performed by Eppie Gibson, MD. It was created on her behalf by Wilburn Mylar, a trained medical scribe. The creation of this record is based on the scribe's personal observations and the provider's statements to them. This document has been checked and approved by the attending provider.

## 2019-12-28 ENCOUNTER — Encounter: Payer: Self-pay | Admitting: Radiation Oncology

## 2019-12-28 ENCOUNTER — Ambulatory Visit
Admission: RE | Admit: 2019-12-28 | Discharge: 2019-12-28 | Disposition: A | Discharge status: Home / Self Care | Payer: BC Managed Care – PPO | Source: Ambulatory Visit | Attending: Radiation Oncology | Admitting: Radiation Oncology

## 2019-12-28 ENCOUNTER — Other Ambulatory Visit: Payer: Self-pay

## 2019-12-28 ENCOUNTER — Ambulatory Visit: Payer: BC Managed Care – PPO

## 2019-12-28 VITALS — BP 106/80 | HR 85 | Temp 98.0°F | Resp 20 | Wt 154.0 lb

## 2019-12-28 DIAGNOSIS — C50812 Malignant neoplasm of overlapping sites of left female breast: Secondary | ICD-10-CM | POA: Diagnosis not present

## 2019-12-28 DIAGNOSIS — Z170 Estrogen receptor positive status [ER+]: Secondary | ICD-10-CM | POA: Insufficient documentation

## 2019-12-28 DIAGNOSIS — Z510 Encounter for antineoplastic radiation therapy: Secondary | ICD-10-CM | POA: Diagnosis not present

## 2019-12-28 DIAGNOSIS — Z9889 Other specified postprocedural states: Secondary | ICD-10-CM | POA: Diagnosis not present

## 2019-12-28 NOTE — Progress Notes (Signed)
Radiation Oncology         [phone] ________________________________  Name: Megan Kent MRN: [id number]  Date: 12/28/2019  DOB: [birth year]  SIMULATION AND TREATMENT PLANNING NOTE  Special treatment procedure   Outpatient  DIAGNOSIS:     ICD-10-CM   1. Malignant neoplasm of overlapping sites of left breast in female, estrogen receptor positive ([place])  C50.812    Z17.0     NARRATIVE:  The patient was brought to the [place].  Identity was confirmed.  All relevant records and images related to the planned course of therapy were reviewed.  The patient freely provided informed written consent to proceed with treatment after reviewing the details related to the planned course of therapy. The consent form was witnessed and verified by the simulation staff.    Then, the patient was set-up in a stable reproducible supine position for radiation therapy with her ipsilateral arm over her head, and her upper body secured in a custom-made Vac-lok device.  CT images were obtained.  Surface markings were placed.  The CT images were loaded into the planning software.    Special treatment procedure:  Special treatment procedure was performed today due to the extra time and effort required by myself to plan and prepare this patient for deep inspiration breath hold technique.  I have determined cardiac sparing to be of benefit to this patient to prevent long term cardiac damage due to radiation of the heart.  Bellows were placed on the patient's abdomen. To facilitate cardiac sparing, the patient was coached by the radiation therapists on breath hold techniques and breathing practice was performed. Practice waveforms were obtained. The patient was then scanned while maintaining breath hold in the treatment position.  This image was then transferred over to the imaging specialist. The imaging specialist then created a fusion of the free breathing and breath hold scans using the chest wall  as the stable structure. I personally reviewed the fusion in axial, coronal and sagittal image planes.  Excellent cardiac sparing was obtained.  I felt the patient is an appropriate candidate for breath hold and the patient will be treated as such.  The image fusion was then reviewed with the patient to reinforce the necessity of reproducible breath hold.  TREATMENT PLANNING NOTE: Treatment planning then occurred.  The radiation prescription was entered and confirmed.     A total of 5 medically necessary complex treatment devices were fabricated and supervised by me: 4 fields with MLCs for custom blocks to protect heart, and lungs;  and, a Vac-lok. MORE COMPLEX DEVICES MAY BE MADE IN DOSIMETRY FOR FIELD IN FIELD BEAMS FOR DOSE HOMOGENEITY.  I have requested : 3D Simulation which is medically necessary to give adequate dose to at risk tissues while sparing lungs and heart.  I have requested a DVH of the following structures: lungs, heart, esophagus, cord.    The patient will receive 50.4 Gy in 28 fractions to the left reconstructed breast and regional nodes with 4 fields.   This will be followed by a boost.  Optical Surface Tracking Plan:  Since intensity modulated radiotherapy (IMRT) and 3D conformal radiation treatment methods are predicated on accurate and precise positioning for treatment, intrafraction motion monitoring is medically necessary to ensure accurate and safe treatment delivery. The ability to quantify intrafraction motion without excessive ionizing radiation dose can only be performed with optical surface tracking. Accordingly, surface imaging offers the opportunity to obtain 3D measurements of patient position throughout IMRT and  3D treatments without excessive radiation exposure. I am ordering optical surface tracking for this patient's upcoming course of radiotherapy.  ________________________________   Reference:  Ursula Alert, J, et al. Surface imaging-based  analysis of intrafraction motion for breast radiotherapy patients.Journal of [place], n. 6, nov. 2014. ISSN [id number].  Available at: <http://www.jacmp.org/index.php/jacmp/article/view/4957>.    -----------------------------------  Eppie Gibson, MD

## 2019-12-29 ENCOUNTER — Encounter: Payer: Self-pay | Admitting: *Deleted

## 2019-12-30 ENCOUNTER — Encounter: Payer: Self-pay | Admitting: Radiation Oncology

## 2019-12-30 ENCOUNTER — Other Ambulatory Visit: Payer: Self-pay

## 2019-12-30 ENCOUNTER — Telehealth: Payer: Self-pay | Admitting: *Deleted

## 2019-12-30 DIAGNOSIS — C50812 Malignant neoplasm of overlapping sites of left female breast: Secondary | ICD-10-CM

## 2019-12-30 NOTE — Telephone Encounter (Signed)
CALLED PATIENT TO ASK ABOUT COMING IN FOR LABS ON 01-04-20 @ 10 AM , PATIENT AGREED TO DO SO

## 2020-01-02 ENCOUNTER — Telehealth: Payer: Self-pay | Admitting: Hematology

## 2020-01-02 NOTE — Telephone Encounter (Signed)
Per 2/11 sch msg. Pt is aware of appt date and time

## 2020-01-03 ENCOUNTER — Other Ambulatory Visit: Payer: Self-pay | Admitting: Hematology

## 2020-01-03 ENCOUNTER — Encounter: Payer: Self-pay | Admitting: Hematology

## 2020-01-03 DIAGNOSIS — C50812 Malignant neoplasm of overlapping sites of left female breast: Secondary | ICD-10-CM | POA: Diagnosis not present

## 2020-01-03 DIAGNOSIS — Z510 Encounter for antineoplastic radiation therapy: Secondary | ICD-10-CM | POA: Diagnosis not present

## 2020-01-03 DIAGNOSIS — Z170 Estrogen receptor positive status [ER+]: Secondary | ICD-10-CM | POA: Diagnosis not present

## 2020-01-03 MED ORDER — APIXABAN 5 MG PO TABS: 5.0000 mg | ORAL_TABLET | Freq: Two times a day (BID) | ORAL | 3 refills | Status: DC

## 2020-01-04 ENCOUNTER — Ambulatory Visit
Admission: RE | Admit: 2020-01-04 | Payer: BC Managed Care – PPO | Source: Ambulatory Visit | Admitting: Radiation Oncology

## 2020-01-04 ENCOUNTER — Ambulatory Visit
Admission: RE | Admit: 2020-01-04 | Discharge: 2020-01-04 | Disposition: A | Discharge status: Home / Self Care | Payer: BC Managed Care – PPO | Source: Ambulatory Visit | Attending: Radiation Oncology | Admitting: Radiation Oncology

## 2020-01-04 ENCOUNTER — Other Ambulatory Visit: Payer: Self-pay

## 2020-01-04 DIAGNOSIS — C50812 Malignant neoplasm of overlapping sites of left female breast: Secondary | ICD-10-CM

## 2020-01-04 DIAGNOSIS — Z170 Estrogen receptor positive status [ER+]: Secondary | ICD-10-CM

## 2020-01-04 DIAGNOSIS — Z510 Encounter for antineoplastic radiation therapy: Secondary | ICD-10-CM | POA: Diagnosis not present

## 2020-01-04 LAB — PREGNANCY, URINE: Preg Test, Ur: NEGATIVE

## 2020-01-05 ENCOUNTER — Ambulatory Visit
Admission: RE | Admit: 2020-01-05 | Discharge: 2020-01-05 | Disposition: A | Discharge status: Home / Self Care | Payer: BC Managed Care – PPO | Source: Ambulatory Visit | Attending: Radiation Oncology | Admitting: Radiation Oncology

## 2020-01-05 ENCOUNTER — Other Ambulatory Visit: Payer: Self-pay

## 2020-01-05 DIAGNOSIS — Z510 Encounter for antineoplastic radiation therapy: Secondary | ICD-10-CM | POA: Diagnosis not present

## 2020-01-05 DIAGNOSIS — Z170 Estrogen receptor positive status [ER+]: Secondary | ICD-10-CM | POA: Diagnosis not present

## 2020-01-05 DIAGNOSIS — C50812 Malignant neoplasm of overlapping sites of left female breast: Secondary | ICD-10-CM

## 2020-01-05 MED ORDER — SONAFINE EX EMUL
1.0000 "application " | Freq: Once | CUTANEOUS | Status: AC
  Administered 2020-01-05: 1 via TOPICAL

## 2020-01-05 MED ORDER — ALRA NON-METALLIC DEODORANT (RAD-ONC)
1.0000 "application " | Freq: Once | TOPICAL | Status: AC
  Administered 2020-01-05: 1 via TOPICAL

## 2020-01-05 NOTE — Progress Notes (Signed)

## 2020-01-06 ENCOUNTER — Ambulatory Visit
Admission: RE | Admit: 2020-01-06 | Discharge: 2020-01-06 | Disposition: A | Discharge status: Home / Self Care | Payer: BC Managed Care – PPO | Source: Ambulatory Visit | Attending: Radiation Oncology | Admitting: Radiation Oncology

## 2020-01-06 ENCOUNTER — Other Ambulatory Visit: Payer: Self-pay

## 2020-01-06 ENCOUNTER — Ambulatory Visit: Payer: BC Managed Care – PPO | Admitting: Radiation Oncology

## 2020-01-06 DIAGNOSIS — Z170 Estrogen receptor positive status [ER+]: Secondary | ICD-10-CM | POA: Diagnosis not present

## 2020-01-06 DIAGNOSIS — C50812 Malignant neoplasm of overlapping sites of left female breast: Secondary | ICD-10-CM | POA: Diagnosis not present

## 2020-01-06 DIAGNOSIS — Z510 Encounter for antineoplastic radiation therapy: Secondary | ICD-10-CM | POA: Diagnosis not present

## 2020-01-09 ENCOUNTER — Ambulatory Visit
Admission: RE | Admit: 2020-01-09 | Discharge: 2020-01-09 | Disposition: A | Discharge status: Home / Self Care | Payer: BC Managed Care – PPO | Source: Ambulatory Visit | Attending: Radiation Oncology | Admitting: Radiation Oncology

## 2020-01-09 ENCOUNTER — Other Ambulatory Visit: Payer: Self-pay

## 2020-01-09 ENCOUNTER — Ambulatory Visit: Payer: BC Managed Care – PPO

## 2020-01-09 DIAGNOSIS — Z170 Estrogen receptor positive status [ER+]: Secondary | ICD-10-CM | POA: Diagnosis not present

## 2020-01-09 DIAGNOSIS — C50812 Malignant neoplasm of overlapping sites of left female breast: Secondary | ICD-10-CM | POA: Diagnosis not present

## 2020-01-09 DIAGNOSIS — Z510 Encounter for antineoplastic radiation therapy: Secondary | ICD-10-CM | POA: Diagnosis not present

## 2020-01-10 ENCOUNTER — Other Ambulatory Visit: Payer: Self-pay

## 2020-01-10 ENCOUNTER — Ambulatory Visit
Admission: RE | Admit: 2020-01-10 | Discharge: 2020-01-10 | Disposition: A | Discharge status: Home / Self Care | Payer: BC Managed Care – PPO | Source: Ambulatory Visit | Attending: Radiation Oncology | Admitting: Radiation Oncology

## 2020-01-10 DIAGNOSIS — Z510 Encounter for antineoplastic radiation therapy: Secondary | ICD-10-CM | POA: Diagnosis not present

## 2020-01-10 DIAGNOSIS — C50812 Malignant neoplasm of overlapping sites of left female breast: Secondary | ICD-10-CM | POA: Diagnosis not present

## 2020-01-10 DIAGNOSIS — Z170 Estrogen receptor positive status [ER+]: Secondary | ICD-10-CM | POA: Diagnosis not present

## 2020-01-11 ENCOUNTER — Ambulatory Visit
Admission: RE | Admit: 2020-01-11 | Discharge: 2020-01-11 | Disposition: A | Discharge status: Home / Self Care | Payer: BC Managed Care – PPO | Source: Ambulatory Visit | Attending: Radiation Oncology | Admitting: Radiation Oncology

## 2020-01-11 ENCOUNTER — Other Ambulatory Visit: Payer: Self-pay

## 2020-01-11 DIAGNOSIS — Z170 Estrogen receptor positive status [ER+]: Secondary | ICD-10-CM | POA: Diagnosis not present

## 2020-01-11 DIAGNOSIS — C50812 Malignant neoplasm of overlapping sites of left female breast: Secondary | ICD-10-CM | POA: Diagnosis not present

## 2020-01-11 DIAGNOSIS — Z510 Encounter for antineoplastic radiation therapy: Secondary | ICD-10-CM | POA: Diagnosis not present

## 2020-01-12 ENCOUNTER — Ambulatory Visit
Admission: RE | Admit: 2020-01-12 | Discharge: 2020-01-12 | Disposition: A | Discharge status: Home / Self Care | Payer: BC Managed Care – PPO | Source: Ambulatory Visit | Attending: Radiation Oncology | Admitting: Radiation Oncology

## 2020-01-12 ENCOUNTER — Other Ambulatory Visit: Payer: Self-pay

## 2020-01-12 DIAGNOSIS — Z170 Estrogen receptor positive status [ER+]: Secondary | ICD-10-CM | POA: Diagnosis not present

## 2020-01-12 DIAGNOSIS — Z510 Encounter for antineoplastic radiation therapy: Secondary | ICD-10-CM | POA: Diagnosis not present

## 2020-01-12 DIAGNOSIS — C50812 Malignant neoplasm of overlapping sites of left female breast: Secondary | ICD-10-CM | POA: Diagnosis not present

## 2020-01-13 ENCOUNTER — Ambulatory Visit
Admission: RE | Admit: 2020-01-13 | Discharge: 2020-01-13 | Disposition: A | Discharge status: Home / Self Care | Payer: BC Managed Care – PPO | Source: Ambulatory Visit | Attending: Radiation Oncology | Admitting: Radiation Oncology

## 2020-01-13 ENCOUNTER — Other Ambulatory Visit: Payer: Self-pay

## 2020-01-13 DIAGNOSIS — Z170 Estrogen receptor positive status [ER+]: Secondary | ICD-10-CM | POA: Diagnosis not present

## 2020-01-13 DIAGNOSIS — Z510 Encounter for antineoplastic radiation therapy: Secondary | ICD-10-CM | POA: Diagnosis not present

## 2020-01-13 DIAGNOSIS — C50812 Malignant neoplasm of overlapping sites of left female breast: Secondary | ICD-10-CM | POA: Diagnosis not present

## 2020-01-16 ENCOUNTER — Ambulatory Visit
Admission: RE | Admit: 2020-01-16 | Discharge: 2020-01-16 | Disposition: A | Discharge status: Home / Self Care | Payer: BC Managed Care – PPO | Source: Ambulatory Visit | Attending: Radiation Oncology | Admitting: Radiation Oncology

## 2020-01-16 ENCOUNTER — Other Ambulatory Visit: Payer: Self-pay

## 2020-01-16 DIAGNOSIS — Z510 Encounter for antineoplastic radiation therapy: Secondary | ICD-10-CM | POA: Diagnosis not present

## 2020-01-16 DIAGNOSIS — C50812 Malignant neoplasm of overlapping sites of left female breast: Secondary | ICD-10-CM | POA: Diagnosis not present

## 2020-01-16 DIAGNOSIS — Z170 Estrogen receptor positive status [ER+]: Secondary | ICD-10-CM | POA: Insufficient documentation

## 2020-01-17 ENCOUNTER — Ambulatory Visit
Admission: RE | Admit: 2020-01-17 | Discharge: 2020-01-17 | Disposition: A | Discharge status: Home / Self Care | Payer: BC Managed Care – PPO | Source: Ambulatory Visit | Attending: Radiation Oncology | Admitting: Radiation Oncology

## 2020-01-17 ENCOUNTER — Other Ambulatory Visit: Payer: Self-pay

## 2020-01-17 DIAGNOSIS — C50812 Malignant neoplasm of overlapping sites of left female breast: Secondary | ICD-10-CM | POA: Diagnosis not present

## 2020-01-17 DIAGNOSIS — Z170 Estrogen receptor positive status [ER+]: Secondary | ICD-10-CM | POA: Diagnosis not present

## 2020-01-17 DIAGNOSIS — Z510 Encounter for antineoplastic radiation therapy: Secondary | ICD-10-CM | POA: Diagnosis not present

## 2020-01-18 ENCOUNTER — Ambulatory Visit
Admission: RE | Admit: 2020-01-18 | Discharge: 2020-01-18 | Disposition: A | Discharge status: Home / Self Care | Payer: BC Managed Care – PPO | Source: Ambulatory Visit | Attending: Radiation Oncology | Admitting: Radiation Oncology

## 2020-01-18 ENCOUNTER — Other Ambulatory Visit: Payer: Self-pay

## 2020-01-18 DIAGNOSIS — Z510 Encounter for antineoplastic radiation therapy: Secondary | ICD-10-CM | POA: Diagnosis not present

## 2020-01-18 DIAGNOSIS — Z170 Estrogen receptor positive status [ER+]: Secondary | ICD-10-CM | POA: Diagnosis not present

## 2020-01-18 DIAGNOSIS — C50812 Malignant neoplasm of overlapping sites of left female breast: Secondary | ICD-10-CM | POA: Diagnosis not present

## 2020-01-19 ENCOUNTER — Encounter: Payer: Self-pay | Admitting: Hematology

## 2020-01-19 ENCOUNTER — Ambulatory Visit
Admission: RE | Admit: 2020-01-19 | Discharge: 2020-01-19 | Disposition: A | Discharge status: Home / Self Care | Payer: BC Managed Care – PPO | Source: Ambulatory Visit | Attending: Radiation Oncology | Admitting: Radiation Oncology

## 2020-01-19 ENCOUNTER — Other Ambulatory Visit: Payer: Self-pay

## 2020-01-19 DIAGNOSIS — C50812 Malignant neoplasm of overlapping sites of left female breast: Secondary | ICD-10-CM | POA: Diagnosis not present

## 2020-01-19 DIAGNOSIS — Z510 Encounter for antineoplastic radiation therapy: Secondary | ICD-10-CM | POA: Diagnosis not present

## 2020-01-19 DIAGNOSIS — Z170 Estrogen receptor positive status [ER+]: Secondary | ICD-10-CM | POA: Diagnosis not present

## 2020-01-20 ENCOUNTER — Ambulatory Visit
Admission: RE | Admit: 2020-01-20 | Discharge: 2020-01-20 | Disposition: A | Discharge status: Home / Self Care | Payer: BC Managed Care – PPO | Source: Ambulatory Visit | Attending: Radiation Oncology | Admitting: Radiation Oncology

## 2020-01-20 ENCOUNTER — Other Ambulatory Visit: Payer: Self-pay

## 2020-01-20 DIAGNOSIS — Z170 Estrogen receptor positive status [ER+]: Secondary | ICD-10-CM | POA: Diagnosis not present

## 2020-01-20 DIAGNOSIS — C50812 Malignant neoplasm of overlapping sites of left female breast: Secondary | ICD-10-CM | POA: Diagnosis not present

## 2020-01-20 DIAGNOSIS — Z510 Encounter for antineoplastic radiation therapy: Secondary | ICD-10-CM | POA: Diagnosis not present

## 2020-01-23 ENCOUNTER — Other Ambulatory Visit: Payer: Self-pay

## 2020-01-23 ENCOUNTER — Ambulatory Visit
Admission: RE | Admit: 2020-01-23 | Discharge: 2020-01-23 | Disposition: A | Discharge status: Home / Self Care | Payer: BC Managed Care – PPO | Source: Ambulatory Visit | Attending: Radiation Oncology | Admitting: Radiation Oncology

## 2020-01-23 DIAGNOSIS — C50812 Malignant neoplasm of overlapping sites of left female breast: Secondary | ICD-10-CM | POA: Diagnosis not present

## 2020-01-23 DIAGNOSIS — Z510 Encounter for antineoplastic radiation therapy: Secondary | ICD-10-CM | POA: Diagnosis not present

## 2020-01-23 DIAGNOSIS — Z170 Estrogen receptor positive status [ER+]: Secondary | ICD-10-CM | POA: Diagnosis not present

## 2020-01-24 ENCOUNTER — Ambulatory Visit
Admission: RE | Admit: 2020-01-24 | Discharge: 2020-01-24 | Disposition: A | Discharge status: Home / Self Care | Payer: BC Managed Care – PPO | Source: Ambulatory Visit | Attending: Radiation Oncology | Admitting: Radiation Oncology

## 2020-01-24 ENCOUNTER — Other Ambulatory Visit: Payer: Self-pay

## 2020-01-24 DIAGNOSIS — C50812 Malignant neoplasm of overlapping sites of left female breast: Secondary | ICD-10-CM | POA: Diagnosis not present

## 2020-01-24 DIAGNOSIS — Z170 Estrogen receptor positive status [ER+]: Secondary | ICD-10-CM | POA: Diagnosis not present

## 2020-01-24 DIAGNOSIS — Z510 Encounter for antineoplastic radiation therapy: Secondary | ICD-10-CM | POA: Diagnosis not present

## 2020-01-25 ENCOUNTER — Other Ambulatory Visit: Payer: Self-pay

## 2020-01-25 ENCOUNTER — Ambulatory Visit
Admission: RE | Admit: 2020-01-25 | Discharge: 2020-01-25 | Disposition: A | Discharge status: Home / Self Care | Payer: BC Managed Care – PPO | Source: Ambulatory Visit | Attending: Radiation Oncology | Admitting: Radiation Oncology

## 2020-01-25 DIAGNOSIS — Z170 Estrogen receptor positive status [ER+]: Secondary | ICD-10-CM | POA: Diagnosis not present

## 2020-01-25 DIAGNOSIS — Z510 Encounter for antineoplastic radiation therapy: Secondary | ICD-10-CM | POA: Diagnosis not present

## 2020-01-25 DIAGNOSIS — C50812 Malignant neoplasm of overlapping sites of left female breast: Secondary | ICD-10-CM | POA: Diagnosis not present

## 2020-01-26 ENCOUNTER — Ambulatory Visit
Admission: RE | Admit: 2020-01-26 | Discharge: 2020-01-26 | Disposition: A | Discharge status: Home / Self Care | Payer: BC Managed Care – PPO | Source: Ambulatory Visit | Attending: Radiation Oncology | Admitting: Radiation Oncology

## 2020-01-26 ENCOUNTER — Other Ambulatory Visit: Payer: Self-pay

## 2020-01-26 DIAGNOSIS — Z510 Encounter for antineoplastic radiation therapy: Secondary | ICD-10-CM | POA: Diagnosis not present

## 2020-01-26 DIAGNOSIS — C50812 Malignant neoplasm of overlapping sites of left female breast: Secondary | ICD-10-CM | POA: Diagnosis not present

## 2020-01-26 DIAGNOSIS — Z170 Estrogen receptor positive status [ER+]: Secondary | ICD-10-CM | POA: Diagnosis not present

## 2020-01-27 ENCOUNTER — Other Ambulatory Visit: Payer: Self-pay

## 2020-01-27 ENCOUNTER — Ambulatory Visit
Admission: RE | Admit: 2020-01-27 | Discharge: 2020-01-27 | Disposition: A | Discharge status: Home / Self Care | Payer: BC Managed Care – PPO | Source: Ambulatory Visit | Attending: Radiation Oncology | Admitting: Radiation Oncology

## 2020-01-27 DIAGNOSIS — C50812 Malignant neoplasm of overlapping sites of left female breast: Secondary | ICD-10-CM | POA: Diagnosis not present

## 2020-01-27 DIAGNOSIS — Z510 Encounter for antineoplastic radiation therapy: Secondary | ICD-10-CM | POA: Diagnosis not present

## 2020-01-27 DIAGNOSIS — Z170 Estrogen receptor positive status [ER+]: Secondary | ICD-10-CM | POA: Diagnosis not present

## 2020-01-30 ENCOUNTER — Other Ambulatory Visit: Payer: Self-pay

## 2020-01-30 ENCOUNTER — Ambulatory Visit
Admission: RE | Admit: 2020-01-30 | Discharge: 2020-01-30 | Disposition: A | Discharge status: Home / Self Care | Payer: BC Managed Care – PPO | Source: Ambulatory Visit | Attending: Radiation Oncology | Admitting: Radiation Oncology

## 2020-01-30 DIAGNOSIS — C50812 Malignant neoplasm of overlapping sites of left female breast: Secondary | ICD-10-CM | POA: Diagnosis not present

## 2020-01-30 DIAGNOSIS — Z510 Encounter for antineoplastic radiation therapy: Secondary | ICD-10-CM | POA: Diagnosis not present

## 2020-01-30 DIAGNOSIS — Z170 Estrogen receptor positive status [ER+]: Secondary | ICD-10-CM | POA: Diagnosis not present

## 2020-01-31 ENCOUNTER — Other Ambulatory Visit: Payer: Self-pay

## 2020-01-31 ENCOUNTER — Ambulatory Visit
Admission: RE | Admit: 2020-01-31 | Discharge: 2020-01-31 | Disposition: A | Discharge status: Home / Self Care | Payer: BC Managed Care – PPO | Source: Ambulatory Visit | Attending: Radiation Oncology | Admitting: Radiation Oncology

## 2020-01-31 DIAGNOSIS — Z510 Encounter for antineoplastic radiation therapy: Secondary | ICD-10-CM | POA: Diagnosis not present

## 2020-01-31 DIAGNOSIS — C50812 Malignant neoplasm of overlapping sites of left female breast: Secondary | ICD-10-CM | POA: Diagnosis not present

## 2020-01-31 DIAGNOSIS — Z170 Estrogen receptor positive status [ER+]: Secondary | ICD-10-CM | POA: Diagnosis not present

## 2020-02-01 ENCOUNTER — Other Ambulatory Visit: Payer: Self-pay

## 2020-02-01 ENCOUNTER — Ambulatory Visit
Admission: RE | Admit: 2020-02-01 | Discharge: 2020-02-01 | Disposition: A | Discharge status: Home / Self Care | Payer: BC Managed Care – PPO | Source: Ambulatory Visit | Attending: Radiation Oncology | Admitting: Radiation Oncology

## 2020-02-01 DIAGNOSIS — Z510 Encounter for antineoplastic radiation therapy: Secondary | ICD-10-CM | POA: Diagnosis not present

## 2020-02-01 DIAGNOSIS — Z170 Estrogen receptor positive status [ER+]: Secondary | ICD-10-CM | POA: Diagnosis not present

## 2020-02-01 DIAGNOSIS — C50812 Malignant neoplasm of overlapping sites of left female breast: Secondary | ICD-10-CM | POA: Diagnosis not present

## 2020-02-02 ENCOUNTER — Other Ambulatory Visit: Payer: Self-pay

## 2020-02-02 ENCOUNTER — Ambulatory Visit
Admission: RE | Admit: 2020-02-02 | Discharge: 2020-02-02 | Disposition: A | Discharge status: Home / Self Care | Payer: BC Managed Care – PPO | Source: Ambulatory Visit | Attending: Radiation Oncology | Admitting: Radiation Oncology

## 2020-02-02 DIAGNOSIS — C50812 Malignant neoplasm of overlapping sites of left female breast: Secondary | ICD-10-CM | POA: Diagnosis not present

## 2020-02-02 DIAGNOSIS — Z170 Estrogen receptor positive status [ER+]: Secondary | ICD-10-CM | POA: Diagnosis not present

## 2020-02-02 DIAGNOSIS — Z510 Encounter for antineoplastic radiation therapy: Secondary | ICD-10-CM | POA: Diagnosis not present

## 2020-02-03 ENCOUNTER — Other Ambulatory Visit: Payer: Self-pay

## 2020-02-03 ENCOUNTER — Ambulatory Visit
Admission: RE | Admit: 2020-02-03 | Discharge: 2020-02-03 | Disposition: A | Discharge status: Home / Self Care | Payer: BC Managed Care – PPO | Source: Ambulatory Visit | Attending: Radiation Oncology | Admitting: Radiation Oncology

## 2020-02-03 DIAGNOSIS — Z510 Encounter for antineoplastic radiation therapy: Secondary | ICD-10-CM | POA: Diagnosis not present

## 2020-02-03 DIAGNOSIS — Z170 Estrogen receptor positive status [ER+]: Secondary | ICD-10-CM | POA: Diagnosis not present

## 2020-02-03 DIAGNOSIS — C50812 Malignant neoplasm of overlapping sites of left female breast: Secondary | ICD-10-CM | POA: Diagnosis not present

## 2020-02-06 ENCOUNTER — Ambulatory Visit
Admission: RE | Admit: 2020-02-06 | Discharge: 2020-02-06 | Disposition: A | Discharge status: Home / Self Care | Payer: BC Managed Care – PPO | Source: Ambulatory Visit | Attending: Radiation Oncology | Admitting: Radiation Oncology

## 2020-02-06 ENCOUNTER — Other Ambulatory Visit: Payer: Self-pay

## 2020-02-06 ENCOUNTER — Other Ambulatory Visit: Payer: Self-pay | Admitting: Radiation Oncology

## 2020-02-06 DIAGNOSIS — Z170 Estrogen receptor positive status [ER+]: Secondary | ICD-10-CM

## 2020-02-06 DIAGNOSIS — C50812 Malignant neoplasm of overlapping sites of left female breast: Secondary | ICD-10-CM | POA: Diagnosis not present

## 2020-02-06 DIAGNOSIS — Z510 Encounter for antineoplastic radiation therapy: Secondary | ICD-10-CM | POA: Diagnosis not present

## 2020-02-06 MED ORDER — HYDROCODONE-ACETAMINOPHEN 5-325 MG PO TABS: 1.0000 | ORAL_TABLET | Freq: Four times a day (QID) | ORAL | 0 refills | Status: DC | PRN

## 2020-02-07 ENCOUNTER — Ambulatory Visit
Admission: RE | Admit: 2020-02-07 | Discharge: 2020-02-07 | Disposition: A | Discharge status: Home / Self Care | Payer: BC Managed Care – PPO | Source: Ambulatory Visit | Attending: Radiation Oncology | Admitting: Radiation Oncology

## 2020-02-07 ENCOUNTER — Other Ambulatory Visit: Payer: Self-pay

## 2020-02-07 DIAGNOSIS — Z170 Estrogen receptor positive status [ER+]: Secondary | ICD-10-CM | POA: Diagnosis not present

## 2020-02-07 DIAGNOSIS — C50812 Malignant neoplasm of overlapping sites of left female breast: Secondary | ICD-10-CM | POA: Diagnosis not present

## 2020-02-07 DIAGNOSIS — Z510 Encounter for antineoplastic radiation therapy: Secondary | ICD-10-CM | POA: Diagnosis not present

## 2020-02-08 ENCOUNTER — Other Ambulatory Visit: Payer: Self-pay

## 2020-02-08 ENCOUNTER — Ambulatory Visit
Admission: RE | Admit: 2020-02-08 | Discharge: 2020-02-08 | Disposition: A | Discharge status: Home / Self Care | Payer: BC Managed Care – PPO | Source: Ambulatory Visit | Attending: Radiation Oncology | Admitting: Radiation Oncology

## 2020-02-08 DIAGNOSIS — Z170 Estrogen receptor positive status [ER+]: Secondary | ICD-10-CM | POA: Diagnosis not present

## 2020-02-08 DIAGNOSIS — Z510 Encounter for antineoplastic radiation therapy: Secondary | ICD-10-CM | POA: Diagnosis not present

## 2020-02-08 DIAGNOSIS — C50812 Malignant neoplasm of overlapping sites of left female breast: Secondary | ICD-10-CM | POA: Diagnosis not present

## 2020-02-09 ENCOUNTER — Other Ambulatory Visit: Payer: Self-pay

## 2020-02-09 ENCOUNTER — Ambulatory Visit
Admission: RE | Admit: 2020-02-09 | Discharge: 2020-02-09 | Disposition: A | Discharge status: Home / Self Care | Payer: BC Managed Care – PPO | Source: Ambulatory Visit | Attending: Radiation Oncology | Admitting: Radiation Oncology

## 2020-02-09 DIAGNOSIS — Z510 Encounter for antineoplastic radiation therapy: Secondary | ICD-10-CM | POA: Diagnosis not present

## 2020-02-09 DIAGNOSIS — Z170 Estrogen receptor positive status [ER+]: Secondary | ICD-10-CM | POA: Diagnosis not present

## 2020-02-09 DIAGNOSIS — C50812 Malignant neoplasm of overlapping sites of left female breast: Secondary | ICD-10-CM | POA: Diagnosis not present
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
## 2020-02-10 ENCOUNTER — Ambulatory Visit
Admission: RE | Admit: 2020-02-10 | Discharge: 2020-02-10 | Disposition: A | Discharge status: Home / Self Care | Payer: BC Managed Care – PPO | Source: Ambulatory Visit | Attending: Radiation Oncology | Admitting: Radiation Oncology

## 2020-02-10 ENCOUNTER — Other Ambulatory Visit: Payer: Self-pay | Admitting: Radiation Oncology

## 2020-02-10 ENCOUNTER — Other Ambulatory Visit: Payer: Self-pay

## 2020-02-10 ENCOUNTER — Ambulatory Visit: Payer: BC Managed Care – PPO | Admitting: Radiation Oncology

## 2020-02-10 DIAGNOSIS — C50812 Malignant neoplasm of overlapping sites of left female breast: Secondary | ICD-10-CM | POA: Diagnosis not present

## 2020-02-10 DIAGNOSIS — Z170 Estrogen receptor positive status [ER+]: Secondary | ICD-10-CM | POA: Diagnosis not present

## 2020-02-10 DIAGNOSIS — Z510 Encounter for antineoplastic radiation therapy: Secondary | ICD-10-CM | POA: Diagnosis not present

## 2020-02-10 MED ORDER — GABAPENTIN 300 MG PO CAPS: 300.0000 mg | ORAL_CAPSULE | Freq: Three times a day (TID) | ORAL | 3 refills | Status: DC

## 2020-02-13 ENCOUNTER — Other Ambulatory Visit: Payer: Self-pay

## 2020-02-13 ENCOUNTER — Ambulatory Visit: Payer: BC Managed Care – PPO | Admitting: Radiation Oncology

## 2020-02-13 ENCOUNTER — Ambulatory Visit
Admission: RE | Admit: 2020-02-13 | Discharge: 2020-02-13 | Disposition: A | Discharge status: Home / Self Care | Payer: BC Managed Care – PPO | Source: Ambulatory Visit | Attending: Radiation Oncology | Admitting: Radiation Oncology

## 2020-02-13 DIAGNOSIS — Z170 Estrogen receptor positive status [ER+]: Secondary | ICD-10-CM | POA: Diagnosis not present

## 2020-02-13 DIAGNOSIS — C50812 Malignant neoplasm of overlapping sites of left female breast: Secondary | ICD-10-CM | POA: Diagnosis not present

## 2020-02-13 DIAGNOSIS — Z510 Encounter for antineoplastic radiation therapy: Secondary | ICD-10-CM | POA: Diagnosis not present

## 2020-02-14 ENCOUNTER — Other Ambulatory Visit: Payer: Self-pay

## 2020-02-14 ENCOUNTER — Ambulatory Visit
Admission: RE | Admit: 2020-02-14 | Discharge: 2020-02-14 | Disposition: A | Discharge status: Home / Self Care | Payer: BC Managed Care – PPO | Source: Ambulatory Visit | Attending: Radiation Oncology | Admitting: Radiation Oncology

## 2020-02-14 DIAGNOSIS — C50812 Malignant neoplasm of overlapping sites of left female breast: Secondary | ICD-10-CM | POA: Diagnosis not present

## 2020-02-14 DIAGNOSIS — Z510 Encounter for antineoplastic radiation therapy: Secondary | ICD-10-CM | POA: Diagnosis not present

## 2020-02-14 DIAGNOSIS — Z170 Estrogen receptor positive status [ER+]: Secondary | ICD-10-CM | POA: Diagnosis not present

## 2020-02-15 ENCOUNTER — Other Ambulatory Visit: Payer: Self-pay

## 2020-02-15 ENCOUNTER — Ambulatory Visit
Admission: RE | Admit: 2020-02-15 | Discharge: 2020-02-15 | Disposition: A | Discharge status: Home / Self Care | Payer: BC Managed Care – PPO | Source: Ambulatory Visit | Attending: Radiation Oncology | Admitting: Radiation Oncology

## 2020-02-15 DIAGNOSIS — C50812 Malignant neoplasm of overlapping sites of left female breast: Secondary | ICD-10-CM | POA: Diagnosis not present

## 2020-02-15 DIAGNOSIS — Z510 Encounter for antineoplastic radiation therapy: Secondary | ICD-10-CM | POA: Diagnosis not present

## 2020-02-15 DIAGNOSIS — Z170 Estrogen receptor positive status [ER+]: Secondary | ICD-10-CM | POA: Diagnosis not present

## 2020-02-16 ENCOUNTER — Ambulatory Visit
Admission: RE | Admit: 2020-02-16 | Discharge: 2020-02-16 | Disposition: A | Discharge status: Home / Self Care | Payer: BC Managed Care – PPO | Source: Ambulatory Visit | Attending: Radiation Oncology | Admitting: Radiation Oncology

## 2020-02-16 ENCOUNTER — Other Ambulatory Visit: Payer: Self-pay

## 2020-02-16 DIAGNOSIS — C50812 Malignant neoplasm of overlapping sites of left female breast: Secondary | ICD-10-CM | POA: Diagnosis not present

## 2020-02-16 DIAGNOSIS — Z510 Encounter for antineoplastic radiation therapy: Secondary | ICD-10-CM | POA: Diagnosis not present

## 2020-02-16 DIAGNOSIS — Z170 Estrogen receptor positive status [ER+]: Secondary | ICD-10-CM | POA: Diagnosis not present

## 2020-02-17 ENCOUNTER — Other Ambulatory Visit: Payer: Self-pay

## 2020-02-17 ENCOUNTER — Ambulatory Visit
Admission: RE | Admit: 2020-02-17 | Discharge: 2020-02-17 | Disposition: A | Discharge status: Home / Self Care | Payer: BC Managed Care – PPO | Source: Ambulatory Visit | Attending: Radiation Oncology | Admitting: Radiation Oncology

## 2020-02-17 DIAGNOSIS — Z510 Encounter for antineoplastic radiation therapy: Secondary | ICD-10-CM | POA: Diagnosis not present

## 2020-02-17 DIAGNOSIS — C50812 Malignant neoplasm of overlapping sites of left female breast: Secondary | ICD-10-CM | POA: Diagnosis not present

## 2020-02-17 DIAGNOSIS — Z170 Estrogen receptor positive status [ER+]: Secondary | ICD-10-CM | POA: Diagnosis not present

## 2020-02-20 ENCOUNTER — Encounter: Payer: Self-pay | Admitting: Radiation Oncology

## 2020-02-20 ENCOUNTER — Inpatient Hospital Stay: Payer: BC Managed Care – PPO

## 2020-02-20 ENCOUNTER — Other Ambulatory Visit: Payer: Self-pay

## 2020-02-20 ENCOUNTER — Ambulatory Visit
Admission: RE | Admit: 2020-02-20 | Discharge: 2020-02-20 | Disposition: A | Discharge status: Home / Self Care | Payer: BC Managed Care – PPO | Source: Ambulatory Visit | Attending: Radiation Oncology | Admitting: Radiation Oncology

## 2020-02-20 ENCOUNTER — Encounter: Payer: Self-pay | Admitting: Hematology

## 2020-02-20 ENCOUNTER — Inpatient Hospital Stay: Payer: BC Managed Care – PPO | Attending: Hematology | Admitting: Hematology

## 2020-02-20 ENCOUNTER — Ambulatory Visit: Payer: BC Managed Care – PPO

## 2020-02-20 VITALS — BP 123/86 | HR 79 | Temp 98.7°F | Resp 18 | Ht 66.0 in | Wt 156.1 lb

## 2020-02-20 DIAGNOSIS — C50812 Malignant neoplasm of overlapping sites of left female breast: Secondary | ICD-10-CM

## 2020-02-20 DIAGNOSIS — Z170 Estrogen receptor positive status [ER+]: Secondary | ICD-10-CM | POA: Diagnosis not present

## 2020-02-20 DIAGNOSIS — Z7901 Long term (current) use of anticoagulants: Secondary | ICD-10-CM | POA: Insufficient documentation

## 2020-02-20 DIAGNOSIS — Z510 Encounter for antineoplastic radiation therapy: Secondary | ICD-10-CM | POA: Diagnosis not present

## 2020-02-20 DIAGNOSIS — Z9012 Acquired absence of left breast and nipple: Secondary | ICD-10-CM | POA: Insufficient documentation

## 2020-02-20 DIAGNOSIS — Z79899 Other long term (current) drug therapy: Secondary | ICD-10-CM | POA: Insufficient documentation

## 2020-02-20 DIAGNOSIS — Z923 Personal history of irradiation: Secondary | ICD-10-CM | POA: Diagnosis not present

## 2020-02-20 DIAGNOSIS — Z86718 Personal history of other venous thrombosis and embolism: Secondary | ICD-10-CM

## 2020-02-20 LAB — CBC WITH DIFFERENTIAL (CANCER CENTER ONLY)
Abs Immature Granulocytes: 0.01 10*3/uL (ref 0.00–0.07)
Basophils Absolute: 0 10*3/uL (ref 0.0–0.1)
Basophils Relative: 1 %
Eosinophils Absolute: 0.1 10*3/uL (ref 0.0–0.5)
Eosinophils Relative: 3 %
HCT: 38.5 % (ref 36.0–46.0)
Hemoglobin: 12.5 g/dL (ref 12.0–15.0)
Immature Granulocytes: 0 %
Lymphocytes Relative: 15 %
Lymphs Abs: 0.5 10*3/uL — ABNORMAL LOW (ref 0.7–4.0)
MCH: 29.3 pg (ref 26.0–34.0)
MCHC: 32.5 g/dL (ref 30.0–36.0)
MCV: 90.4 fL (ref 80.0–100.0)
Monocytes Absolute: 0.4 10*3/uL (ref 0.1–1.0)
Monocytes Relative: 12 %
Neutro Abs: 2.5 10*3/uL (ref 1.7–7.7)
Neutrophils Relative %: 69 %
Platelet Count: 211 10*3/uL (ref 150–400)
RBC: 4.26 MIL/uL (ref 3.87–5.11)
RDW: 13.3 % (ref 11.5–15.5)
WBC Count: 3.5 10*3/uL — ABNORMAL LOW (ref 4.0–10.5)
nRBC: 0 % (ref 0.0–0.2)

## 2020-02-20 LAB — CMP (CANCER CENTER ONLY)
ALT: 34 U/L (ref 0–44)
AST: 18 U/L (ref 15–41)
Albumin: 4.1 g/dL (ref 3.5–5.0)
Alkaline Phosphatase: 120 U/L (ref 38–126)
Anion gap: 10 (ref 5–15)
BUN: 12 mg/dL (ref 6–20)
CO2: 29 mmol/L (ref 22–32)
Calcium: 9.4 mg/dL (ref 8.9–10.3)
Chloride: 104 mmol/L (ref 98–111)
Creatinine: 0.97 mg/dL (ref 0.44–1.00)
GFR, Est AFR Am: >60 mL/min (ref >60)
GFR, Estimated: >60 mL/min (ref >60)
Glucose, Bld: 95 mg/dL (ref 70–99)
Potassium: 4.2 mmol/L (ref 3.5–5.1)
Sodium: 143 mmol/L (ref 135–145)
Total Bilirubin: 0.3 mg/dL (ref 0.3–1.2)
Total Protein: 7 g/dL (ref 6.5–8.1)

## 2020-02-21 ENCOUNTER — Ambulatory Visit: Payer: BC Managed Care – PPO

## 2020-02-21 LAB — FOLLICLE STIMULATING HORMONE: FSH: 110 m[IU]/mL

## 2020-02-21 LAB — ESTRADIOL: Estradiol: <5 pg/mL

## 2020-02-22 ENCOUNTER — Ambulatory Visit: Payer: BC Managed Care – PPO

## 2020-02-22 ENCOUNTER — Encounter: Payer: Self-pay | Admitting: Hematology

## 2020-02-22 ENCOUNTER — Other Ambulatory Visit: Payer: Self-pay | Admitting: Hematology

## 2020-02-22 ENCOUNTER — Encounter: Payer: Self-pay | Admitting: *Deleted

## 2020-02-22 DIAGNOSIS — E2839 Other primary ovarian failure: Secondary | ICD-10-CM

## 2020-02-22 MED ORDER — ANASTROZOLE 1 MG PO TABS: 1.0000 mg | ORAL_TABLET | Freq: Every day | ORAL | 3 refills | Status: DC

## 2020-03-20 ENCOUNTER — Other Ambulatory Visit: Payer: Self-pay

## 2020-03-20 MED ORDER — APIXABAN 5 MG PO TABS: 5.0000 mg | ORAL_TABLET | Freq: Two times a day (BID) | ORAL | 3 refills | Status: DC

## 2020-03-20 NOTE — Telephone Encounter (Signed)
Received fax from [facility] for Eliquis Refill and refilled as verbal with readback from Dr. Burr Medico.  Gardiner Rhyme, RN

## 2020-03-29 ENCOUNTER — Telehealth: Payer: Self-pay

## 2020-03-29 NOTE — Telephone Encounter (Signed)
I called the patient today about her upcoming follow-up appointment in radiation oncology.   Given the state of the COVID-19 pandemic, concerning case numbers in our community, and guidance from [facility], I offered a phone assessment with the patient to determine if coming to the clinic was necessary. She accepted.  I let the patient know that I had spoken with Dr. Isidore Moos, and she wanted them to know the importance of washing their hands for at least 20 seconds at a time, especially after going out in public, and before they eat.  Limit going out in public whenever possible. Do not touch your face, unless your hands are clean, such as when bathing. Get plenty of rest, eat well, and stay hydrated. Patient verbalized understanding and agreement.   The patient denies any symptomatic concerns.  Specifically, they report good healing of their skin in the radiation fields.  Skin is intact and "almost completely back to normal". I recommended that she continue skin care by applying oil or lotion with vitamin E to the skin in the radiation fields, BID, for 2 more months.  She reports some continued mild fatigue, but states she just started her anastrozole prescription, and thinks that is the cause and not a lingering side effect from radiation. She denies any swelling to the breast or axilla. She does report some mild stiffness/range of motion issues to the left arm, but states she is scheduled for another session of physical therapy to address the matter. Overall she reports she is doing very well and sounded in good spirits.   Continue follow-up with medical oncology - follow-up is scheduled on 06/25/2020 with Lacie Burton-NP and the Survivorship Program.  I explained that yearly mammograms are important for patients with intact breast tissue, and physical exams are important after mastectomy for patients that cannot undergo mammography. Patient verbalized understanding and agreement.  I encouraged  her to call if she had further questions or concerns about her healing. Otherwise, she will follow-up PRN in radiation oncology. Patient is pleased with this plan, and we will cancel her upcoming follow-up to reduce the risk of COVID-19 transmission. Patient was appreciative of my call, and very complementary of Dr. Isidore Moos and her team's care.

## 2020-03-30 ENCOUNTER — Ambulatory Visit: Payer: BC Managed Care – PPO | Admitting: Radiation Oncology

## 2020-03-30 NOTE — Progress Notes (Signed)
  Patient Name: Megan Kent MRN: [id number] DOB: 1966/01/01 Referring Physician: Hulan Fess (Profile Not Attached) Date of Service: 02/29/2020 [facility], [place]                                                        End Of Treatment Note  Diagnoses: C50.812-Malignant neoplasm of overlapping sites of left female breast  Cancer Staging: Cancer Staging Cancer of overlapping sites of left female breast [facility]) Staging form: Breast, AJCC 8th Edition - Pathologic stage from 06/24/2019: Stage IB (pT2, pN1a, cM0, G2, ER+, PR+, HER2-, Oncotype DX score: 25) - Signed by Truitt Merle, MD on 08/10/2019  Intent: curative  Radiation Treatment Dates: 01/05/2020 through 02/20/2020 Site Technique Total Dose (Gy) Dose per Fx (Gy) Completed Fx Beam Energies  Chest Wall, Left: CW_Lt 3D 50.4/50.4 1.8 28/28 10X, 6X  Chest Wall, Left: CW_Lt_SCV_PAB 3D 50.4/50.4 1.8 28/28 6X, 10X  Chest Wall, Left: CW_Lt_Bst Electron 10/10 2 5/5 6E   Narrative: The patient tolerated radiation therapy relatively well.   Plan: The patient will follow-up with radiation oncology in 1 mo, or PRN .  -----------------------------------  Eppie Gibson, MD

## 2020-04-04 ENCOUNTER — Other Ambulatory Visit: Payer: Self-pay

## 2020-04-04 ENCOUNTER — Ambulatory Visit
Admission: RE | Admit: 2020-04-04 | Discharge: 2020-04-04 | Disposition: A | Discharge status: Home / Self Care | Payer: BC Managed Care – PPO | Source: Ambulatory Visit | Attending: Hematology | Admitting: Hematology

## 2020-04-04 DIAGNOSIS — Z13820 Encounter for screening for osteoporosis: Secondary | ICD-10-CM | POA: Diagnosis not present

## 2020-04-04 DIAGNOSIS — Z780 Asymptomatic menopausal state: Secondary | ICD-10-CM | POA: Diagnosis not present

## 2020-04-04 DIAGNOSIS — E2839 Other primary ovarian failure: Secondary | ICD-10-CM

## 2020-04-13 ENCOUNTER — Encounter: Payer: Self-pay | Admitting: Hematology

## 2020-04-18 DIAGNOSIS — Z1231 Encounter for screening mammogram for malignant neoplasm of breast: Secondary | ICD-10-CM | POA: Diagnosis not present

## 2020-04-18 DIAGNOSIS — C50912 Malignant neoplasm of unspecified site of left female breast: Secondary | ICD-10-CM | POA: Diagnosis not present

## 2020-04-18 DIAGNOSIS — C773 Secondary and unspecified malignant neoplasm of axilla and upper limb lymph nodes: Secondary | ICD-10-CM | POA: Diagnosis not present

## 2020-04-19 DIAGNOSIS — D224 Melanocytic nevi of scalp and neck: Secondary | ICD-10-CM | POA: Diagnosis not present

## 2020-04-19 DIAGNOSIS — D225 Melanocytic nevi of trunk: Secondary | ICD-10-CM | POA: Diagnosis not present

## 2020-04-19 DIAGNOSIS — L814 Other melanin hyperpigmentation: Secondary | ICD-10-CM | POA: Diagnosis not present

## 2020-04-19 DIAGNOSIS — Z86018 Personal history of other benign neoplasm: Secondary | ICD-10-CM | POA: Diagnosis not present

## 2020-04-30 DIAGNOSIS — Z6826 Body mass index (BMI) 26.0-26.9, adult: Secondary | ICD-10-CM | POA: Diagnosis not present

## 2020-04-30 DIAGNOSIS — Z01419 Encounter for gynecological examination (general) (routine) without abnormal findings: Secondary | ICD-10-CM | POA: Diagnosis not present

## 2020-05-01 DIAGNOSIS — Z01419 Encounter for gynecological examination (general) (routine) without abnormal findings: Secondary | ICD-10-CM | POA: Diagnosis not present

## 2020-05-10 DIAGNOSIS — M25612 Stiffness of left shoulder, not elsewhere classified: Secondary | ICD-10-CM | POA: Diagnosis not present

## 2020-05-10 DIAGNOSIS — R29898 Other symptoms and signs involving the musculoskeletal system: Secondary | ICD-10-CM | POA: Diagnosis not present

## 2020-05-10 DIAGNOSIS — M79622 Pain in left upper arm: Secondary | ICD-10-CM | POA: Diagnosis not present

## 2020-05-10 DIAGNOSIS — M6281 Muscle weakness (generalized): Secondary | ICD-10-CM | POA: Diagnosis not present

## 2020-05-24 DIAGNOSIS — C773 Secondary and unspecified malignant neoplasm of axilla and upper limb lymph nodes: Secondary | ICD-10-CM | POA: Diagnosis not present

## 2020-05-31 DIAGNOSIS — C773 Secondary and unspecified malignant neoplasm of axilla and upper limb lymph nodes: Secondary | ICD-10-CM | POA: Diagnosis not present

## 2020-06-05 ENCOUNTER — Encounter: Payer: Self-pay | Admitting: Hematology

## 2020-06-06 ENCOUNTER — Other Ambulatory Visit: Payer: Self-pay

## 2020-06-06 DIAGNOSIS — Z170 Estrogen receptor positive status [ER+]: Secondary | ICD-10-CM

## 2020-06-06 DIAGNOSIS — C50812 Malignant neoplasm of overlapping sites of left female breast: Secondary | ICD-10-CM

## 2020-06-06 DIAGNOSIS — Z86718 Personal history of other venous thrombosis and embolism: Secondary | ICD-10-CM

## 2020-06-06 MED ORDER — ANASTROZOLE 1 MG PO TABS: 1.0000 mg | ORAL_TABLET | Freq: Every day | ORAL | 3 refills | Status: DC

## 2020-06-06 MED ORDER — APIXABAN 5 MG PO TABS: 5.0000 mg | ORAL_TABLET | Freq: Two times a day (BID) | ORAL | 3 refills | Status: DC

## 2020-06-07 DIAGNOSIS — C773 Secondary and unspecified malignant neoplasm of axilla and upper limb lymph nodes: Secondary | ICD-10-CM | POA: Diagnosis not present

## 2020-06-14 DIAGNOSIS — C773 Secondary and unspecified malignant neoplasm of axilla and upper limb lymph nodes: Secondary | ICD-10-CM | POA: Diagnosis not present

## 2020-06-25 ENCOUNTER — Telehealth: Payer: Self-pay | Admitting: Nurse Practitioner

## 2020-06-25 ENCOUNTER — Inpatient Hospital Stay: Payer: BC Managed Care – PPO | Admitting: Nurse Practitioner

## 2020-06-25 NOTE — Telephone Encounter (Signed)
Scheduled per 8/9 sch message. Pt is aware of appt time and date. 

## 2020-07-03 NOTE — Progress Notes (Signed)
CLINIC:  Survivorship   Patient Care Team: Hulan Fess, MD as PCP - General (Family Medicine) Eppie Gibson, MD as Attending Physician (Radiation Oncology) Rockwell Germany, RN as Oncology Nurse Navigator Mauro Kaufmann, RN as Oncology Nurse Navigator Truitt Merle, MD as Consulting Physician (Hematology) Aldean Jewett, MD as Referring Physician (Breast Surgery) Alla Feeling, NP as Nurse Practitioner (Nurse Practitioner) Remus Blake, MD as Referring Physician (Plastic Surgery)   REASON FOR VISIT:  Routine follow-up post-treatment for a recent history of breast cancer.  BRIEF ONCOLOGIC HISTORY:  Oncology History Overview Note  Cancer Staging Cancer of overlapping sites of left female breast [facility]) Staging form: Breast, AJCC 8th Edition - Pathologic stage from 06/24/2019: Stage IA (pT1c, pN1a, cM0, G2, ER+, PR+, HER2-, Oncotype DX score: 25) - Signed by Truitt Merle, MD on 08/01/2019    Cancer of overlapping sites of left female breast ([place])  05/12/2019 Mammogram   Mammogram and [place] Left breast 05/12/19 left breast reveals a hypoechoic solid lesion with irregular margins and associated shadowing measuring 16 x 12 mm at the upper inner quadrant. Less prominent hypoechoic solid lesion is present at the superior  retroareolar region measuring 8 x 6 mm.     05/19/2019 Initial Biopsy   05/19/19 1.  Left breast, superior, needle core biopsy: Invasive ductal carcinoma, Grade I of III, at least 1.0 cm in this limited sample. 2.  Left breast, upper inner quadrant, needle core biopsy: Invasive ductal carcinoma, Grade I of III, at least 0.8 cm in this limited sample.                     In the context of the patient's IHC results (2+), the HER2 is interpreted as NEGATIVE Estrogen Receptor (ER) Positive Strong 99  Progesterone receptor (PR) Positive Intermediate 1    06/06/2019 Breast MRI   B/l Breast MRI 06/06/19  Evidence of multicentric carcinoma in left breast, measuring up to at least 6.5  cm in extent. This encompasses the 2 biopsied lesions located medially and laterally as well as additional nonbiopsied suspicious masses and nonmass enhancement.  No evidence of adenopathy. The right breast shows no significant abnormality.   06/24/2019 Cancer Staging   Staging form: Breast, AJCC 8th Edition - Pathologic stage from 06/24/2019: Stage IB (pT2, pN1a, cM0, G2, ER+, PR+, HER2-, Oncotype DX score: 25) - Signed by Truitt Merle, MD on 08/10/2019   06/24/2019 Surgery   Left skin and nipple-sparing total mastectomy, left axillary slnb 06/24/2019  Immediate tissue expander reconstruction (Dr. Sandi Mealy)  1.     Breast, left, mastectomy:             Multifocal invasive ductal carcinoma (3 foci)                Invasive foci measure 1.1 x 0.8 cm  (grade 1); 1.9 x 1.7 (grade 2); 1.1 cm (grade 2).             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             Tumor impinges upon and sometimes involves the margin in the anterior aspect.                                                      2) - Breast,  Nipple Duct, within left nipple                                                       3) - Lymph Node, Sentinel, Left axillary sentinel node level 1                                     4) - Lymph Node, Sentinel, Level 2 sentinel node       07/01/2019 Imaging   Doppler 07/01/19   The examination is positive for DVT in the left distal main femoral, popliteal, and posterior tibial veins.                   08/01/2019 Initial Diagnosis   Cancer of overlapping sites of left female breast ([place])   08/12/2019 - 10/17/2019 Chemotherapy   TC every 2 weeks for 4 cycles starting 08/12/19. Dose reduced with C2 due to neutropenic fever was given cipro. Last cycle on 10/17/19.    11/22/2019 Surgery   Left breast resection of nipple areolar complex by Dr. Jackson Latino At [facility]   Breast tissue/left nipple areolar complex, excision:  Small focus of residual invasive ductal carcinoma, grade 1.  Tumor measures 0.3 cm  (3 mm) in greatest dimension.  Examined surgical margins are free of involvement.  Closest margin is posterior or deep at approximately 6 mm.  Surrounding breast tissue shows reactive changes/fibrosis consistent  with prior surgical procedure    01/05/2020 - 02/20/2020 Radiation Therapy   Adjuvant Radiation with Dr. Isidore Moos 01/05/20-02/20/20   03/17/2020 -  Anti-estrogen oral therapy   Tamoxifen or Anastrozole once daily starting 03/17/20.    07/04/2020 Survivorship   SCP delivered by Cira Rue, NP     INTERVAL HISTORY:  Ms. Lok presents to the [facility] today for our initial meeting to review her survivorship care plan detailing her treatment course for breast cancer, as well as monitoring long-term side effects of that treatment, education regarding health maintenance, screening, and overall wellness and health promotion.     Ms. Cannella overall feels well except still experiencing fatigue.  She remains functional, she is exercising with walking.  She has a normal appetite.  She got her COVID-19 vaccine after she completed radiation.  She started anastrozole which she is tolerating well.  She has some stiffness and tightness in her hands and feet in the morning that resolves throughout the day.  Able to function well.  Denies significant hot flashes.  Denies concerns in her breast such as new lump, mass, nipple discharge.  She is planning to have reconstruction on September 9.  She continues Eliquis, denies bleeding.  Denies fever, chills, cough, chest pain, dyspnea, change in bowel habits, bleeding, pain, or new issues.   ONCOLOGY TREATMENT TEAM:  1. Surgeon:  Dr. Jackson [facility] at [facility] 2. Medical Oncologist: Dr. Burr Medico 3. Radiation Oncologist: Dr. Isidore Moos    PAST MEDICAL/SURGICAL HISTORY:  Past Medical History:  Diagnosis Date  . Asthma    she does not need to take medication  . Cancer [facility])    Past Surgical History:  Procedure Laterality Date  . MASTECTOMY Left 06/24/2019   with  [facility]     ALLERGIES:  No Known Allergies   CURRENT MEDICATIONS:  Outpatient  Encounter Medications as of 07/04/2020  Medication Sig Note  . anastrozole (ARIMIDEX) 1 MG tablet Take 1 tablet (1 mg total) by mouth daily.   Marland Kitchen apixaban (ELIQUIS) 5 MG TABS tablet Take 1 tablet (5 mg total) by mouth 2 (two) times daily.   Marland Kitchen buPROPion (WELLBUTRIN XL) 150 MG 24 hr tablet Take 150 mg by mouth daily.   Marland Kitchen dexamethasone (DECADRON) 4 MG tablet Take 1 tablet (4 mg total) by mouth 2 (two) times daily with a meal. 1 tablet (8m) 2 times, morning and afternoon,  the day before chemo; 1 tablet daily x 3 days starting the day after chemo (Patient not taking: Reported on 11/17/2019) 08/19/2019: Completed dose for time period on Monday.. not needed again until next chemo  . escitalopram (LEXAPRO) 20 MG tablet Take 20 mg by mouth daily.   .Marland Kitchengabapentin (NEURONTIN) 300 MG capsule Take 1 capsule (300 mg total) by mouth 3 (three) times daily. Take for pain.   .Marland KitchenHYDROcodone-acetaminophen (NORCO) 5-325 MG tablet Take 1 tablet by mouth every 6 (six) hours as needed for moderate pain. Take with food.   .Marland KitchenLORazepam (ATIVAN) 0.5 MG tablet Take 1-2 tablets (0.5-1 mg total) by mouth at bedtime as needed for sleep. (Patient not taking: Reported on 11/17/2019)   . ondansetron (ZOFRAN) 8 MG tablet Take 1 tablet (8 mg total) by mouth 2 (two) times daily. (Patient not taking: Reported on 11/17/2019)   . prochlorperazine (COMPAZINE) 10 MG tablet Take 1 tablet (10 mg total) by mouth every 6 (six) hours as needed for nausea or vomiting. (Patient not taking: Reported on 11/17/2019)    No facility-administered encounter medications on file as of 07/04/2020.     ONCOLOGIC FAMILY HISTORY:  Family History  Problem Relation Age of Onset  . Cancer Other        breast cancer in GM's sister  . Cancer Other        breast cancer in great GM      GENETIC COUNSELING/TESTING: N/A   PHYSICAL EXAMINATION:  Vital Signs:   Vitals:    07/04/20 1029  BP: 121/86  Pulse: 80  Resp: 18  Temp: 97.8 F (36.6 C)  SpO2: 99%   Filed Weights   07/04/20 1029  Weight: 160 lb 1.6 oz (72.6 kg)   General: well-appearing female in no acute distress.   HEENT:  Sclerae anicteric.  Lymph: No cervical, supraclavicular, or infraclavicular lymphadenopathy noted on palpation.  Cardiovascular: Regular rate and rhythm. Respiratory: Clear; breathing non-labored.  Neuro: No focal deficits. Steady gait.  Psych: Mood and affect normal and appropriate for situation.  Extremities: No edema. MSK:  Full range of motion in bilateral upper extremities Skin: Warm and dry. Breast: S/p left mastectomy and bilateral reconstruction, no palpable mass or nodularity in the breast, chest wall or axilla that I could appreciate  LABORATORY DATA:  None for this visit.  DIAGNOSTIC IMAGING:  None for this visit.      ASSESSMENT AND PLAN:  Ms.. DRauschis a pleasant 54y.o. 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  She presents to the Survivorship Clinic for our initial meeting and routine follow-up post-completion of treatment for breast cancer.    1. Stage IB left breast cancer:  Ms. DPanozzohas recovered well from definitive treatment for breast cancer except residual fatigue. Breast exam is benign, no concern for recurrence..Marland Kitchen  She will follow-up with her medical oncologist, Dr. Burr Medico in 10/2020 with history and physical exam per surveillance protocol.  She will continue her anti-estrogen therapy with Anastrozole. Thus far, she is tolerating it well with mild stiffness in her hands and feet and fatigue, otherwise minimal side effects. She was instructed to make Dr. Burr Medico or myself aware if she begins to experience any worsening side effects of the medication and I could see  her back in clinic to help manage those side effects, as needed. Today, a comprehensive survivorship care plan and treatment summary was reviewed with the patient today detailing her breast cancer diagnosis, treatment course, potential late/long-term effects of treatment, appropriate follow-up care with recommendations for the future, and patient education resources.  A copy of this summary, along with a letter will be sent to the patient's primary care provider via mail/fax/In Basket message after today's visit.    2. Bone health:  Given Ms. Slovacek's age/history of breast cancer, she may be at risk for bone demineralization.  Her last DEXA scan was 04/04/2020 which was normal. In the meantime, she was encouraged to increase her consumption of foods rich in calcium, as well as increase her weight-bearing activities.  She was given education on specific activities to promote bone health.  3. Cancer screening:  Due to Ms. Shimamoto's history and her age, she should receive screening for skin cancers, colon cancer, and gynecologic cancers.  She is overdue for her first screening colonoscopy, she agrees to GI referral which I placed today.  She denies changes in her bowel habits or family history of colon cancer.  The information and recommendations are listed on the patient's comprehensive care plan/treatment summary and were reviewed in detail with the patient.    4. Health maintenance and wellness promotion: Ms. Fike was encouraged to consume 5-7 servings of fruits and vegetables per day. We reviewed the "Nutrition Rainbow" handout, as well as the handout "Take Control of Your Health and Reduce Your Cancer Risk" from the [place].  She was also encouraged to engage in moderate to vigorous exercise for 30 minutes per day most days of the week. We discussed the [employer] fitness program, which is designed for cancer survivors to help them become more physically fit after cancer treatments.   She was instructed to limit her alcohol consumption and continue to abstain from tobacco use.    5. Support services/counseling: It is not uncommon for this period of the patient's cancer care trajectory to be one of many emotions and stressors.  We discussed an opportunity for her to participate in the next session of [facility] ("Finding Your New Normal") support group series designed for patients after they have completed treatment.   Ms. Heatley was encouraged to take advantage of our many other support services programs, support groups, and/or counseling in coping with her new life as a cancer survivor after completing anti-cancer treatment.  She was offered support today through active listening and expressive supportive counseling.  She was given information regarding our available services and encouraged to contact me with any questions or for help enrolling in any of our support group/programs.  I have sent a message to our breast nurse navigator's to reach out to her for upcoming support group information.   Dispo:   -Return to cancer center in 4 months -Mammogram due in 04/2021 -Follow up with surgery as scheduled -Refer to GI -She is welcome to return back to the Survivorship Clinic at any time; no  additional follow-up needed at this time.  -Consider referral back to survivorship as a long-term survivor for continued surveillance  A total of (40) minutes of face-to-face time was spent with this patient with greater than 50% of that time in counseling and care-coordination.   Cira Rue, NP Survivorship Program [facility] [phone]   Note: PRIMARY CARE PROVIDER Hulan Fess, [place] [phone]

## 2020-07-04 ENCOUNTER — Other Ambulatory Visit: Payer: Self-pay

## 2020-07-04 ENCOUNTER — Inpatient Hospital Stay: Payer: BC Managed Care – PPO | Attending: Nurse Practitioner | Admitting: Nurse Practitioner

## 2020-07-04 ENCOUNTER — Encounter: Payer: Self-pay | Admitting: Nurse Practitioner

## 2020-07-04 VITALS — BP 121/86 | HR 80 | Temp 97.8°F | Resp 18 | Ht 66.0 in | Wt 160.1 lb

## 2020-07-04 DIAGNOSIS — Z79899 Other long term (current) drug therapy: Secondary | ICD-10-CM | POA: Diagnosis not present

## 2020-07-04 DIAGNOSIS — C50812 Malignant neoplasm of overlapping sites of left female breast: Secondary | ICD-10-CM | POA: Insufficient documentation

## 2020-07-04 DIAGNOSIS — Z9221 Personal history of antineoplastic chemotherapy: Secondary | ICD-10-CM | POA: Insufficient documentation

## 2020-07-04 DIAGNOSIS — Z1211 Encounter for screening for malignant neoplasm of colon: Secondary | ICD-10-CM

## 2020-07-04 DIAGNOSIS — Z923 Personal history of irradiation: Secondary | ICD-10-CM | POA: Insufficient documentation

## 2020-07-04 DIAGNOSIS — Z170 Estrogen receptor positive status [ER+]: Secondary | ICD-10-CM | POA: Diagnosis not present

## 2020-07-04 DIAGNOSIS — Z7901 Long term (current) use of anticoagulants: Secondary | ICD-10-CM | POA: Diagnosis not present

## 2020-07-05 ENCOUNTER — Telehealth: Payer: Self-pay | Admitting: *Deleted

## 2020-07-05 ENCOUNTER — Telehealth: Payer: Self-pay | Admitting: Nurse Practitioner

## 2020-07-05 NOTE — Telephone Encounter (Signed)
Called and left voicemail with my contact information to discuss any resources and support group information that she may interested in.

## 2020-07-05 NOTE — Telephone Encounter (Signed)
Scheduled per 8/18 los. Unable to reach pt. Left voicemail with appt time and date.

## 2020-07-18 DIAGNOSIS — Z79899 Other long term (current) drug therapy: Secondary | ICD-10-CM | POA: Diagnosis not present

## 2020-07-18 DIAGNOSIS — Z86718 Personal history of other venous thrombosis and embolism: Secondary | ICD-10-CM | POA: Diagnosis not present

## 2020-07-18 DIAGNOSIS — Z79891 Long term (current) use of opiate analgesic: Secondary | ICD-10-CM | POA: Diagnosis not present

## 2020-07-18 DIAGNOSIS — C50911 Malignant neoplasm of unspecified site of right female breast: Secondary | ICD-10-CM | POA: Diagnosis not present

## 2020-07-18 DIAGNOSIS — Z79811 Long term (current) use of aromatase inhibitors: Secondary | ICD-10-CM | POA: Diagnosis not present

## 2020-07-18 DIAGNOSIS — Z9882 Breast implant status: Secondary | ICD-10-CM | POA: Diagnosis not present

## 2020-07-18 DIAGNOSIS — N6489 Other specified disorders of breast: Secondary | ICD-10-CM | POA: Diagnosis not present

## 2020-07-18 DIAGNOSIS — J45909 Unspecified asthma, uncomplicated: Secondary | ICD-10-CM | POA: Diagnosis not present

## 2020-07-18 DIAGNOSIS — F329 Major depressive disorder, single episode, unspecified: Secondary | ICD-10-CM | POA: Diagnosis not present

## 2020-07-18 DIAGNOSIS — Z923 Personal history of irradiation: Secondary | ICD-10-CM | POA: Diagnosis not present

## 2020-07-18 DIAGNOSIS — N651 Disproportion of reconstructed breast: Secondary | ICD-10-CM | POA: Diagnosis not present

## 2020-07-18 DIAGNOSIS — C50912 Malignant neoplasm of unspecified site of left female breast: Secondary | ICD-10-CM | POA: Diagnosis not present

## 2020-07-18 DIAGNOSIS — N62 Hypertrophy of breast: Secondary | ICD-10-CM | POA: Diagnosis not present

## 2020-07-18 DIAGNOSIS — Z7901 Long term (current) use of anticoagulants: Secondary | ICD-10-CM | POA: Diagnosis not present

## 2020-07-18 DIAGNOSIS — F419 Anxiety disorder, unspecified: Secondary | ICD-10-CM | POA: Diagnosis not present

## 2020-08-01 ENCOUNTER — Encounter: Payer: Self-pay | Admitting: Nurse Practitioner

## 2020-08-28 ENCOUNTER — Telehealth: Payer: Self-pay

## 2020-08-28 NOTE — Telephone Encounter (Signed)
Referral for first screening colonoscopy faxed to [facility] at [phone].

## 2020-08-28 NOTE — Telephone Encounter (Signed)
Megan Kent left vm stating that the anastrozole is causing increased joint pain and stiffness.  She is experiencing pain and stiffness in her hands and feet in the am that no longer completely resolves. She states she is now awakened at night with joint pain.  This pain last throughout the day.  She is requesting a change in her AI therapy.  Forwarded to Dr. Burr Medico.

## 2020-08-29 ENCOUNTER — Telehealth: Payer: Self-pay | Admitting: Hematology

## 2020-08-29 ENCOUNTER — Other Ambulatory Visit: Payer: Self-pay | Admitting: Hematology

## 2020-08-29 MED ORDER — EXEMESTANE 25 MG PO TABS: 25.0000 mg | ORAL_TABLET | Freq: Every day | ORAL | 2 refills | Status: DC

## 2020-08-29 NOTE — Telephone Encounter (Signed)
I received a message from patient, she has developed significant arthralgia from anastrozole.  I called her back, instructed her to stop anastrozole, and I called in exemestane to her pharmacy she knows to call me if she develops problems from exemestane, I plan to see her back in Dec as scheduled.   Truitt Merle  08/29/2020

## 2020-08-30 ENCOUNTER — Telehealth: Payer: Self-pay

## 2020-08-30 NOTE — Telephone Encounter (Signed)
I spoke with Megan Kent and let her know Dr Burr Medico sent a rx for exemestane to her pharmacy.  I told her to start the exemestane after the side effects from anastrozole have gone.  She verbalized understanding.

## 2020-09-21 ENCOUNTER — Other Ambulatory Visit: Payer: Self-pay

## 2020-09-21 DIAGNOSIS — Z86718 Personal history of other venous thrombosis and embolism: Secondary | ICD-10-CM

## 2020-09-21 DIAGNOSIS — C50812 Malignant neoplasm of overlapping sites of left female breast: Secondary | ICD-10-CM

## 2020-09-21 MED ORDER — APIXABAN 5 MG PO TABS: 5.0000 mg | ORAL_TABLET | Freq: Two times a day (BID) | ORAL | 0 refills | Status: DC

## 2020-10-04 DIAGNOSIS — F338 Other recurrent depressive disorders: Secondary | ICD-10-CM | POA: Diagnosis not present

## 2020-10-04 DIAGNOSIS — D229 Melanocytic nevi, unspecified: Secondary | ICD-10-CM | POA: Diagnosis not present

## 2020-10-04 DIAGNOSIS — E7800 Pure hypercholesterolemia, unspecified: Secondary | ICD-10-CM | POA: Diagnosis not present

## 2020-10-04 DIAGNOSIS — Z853 Personal history of malignant neoplasm of breast: Secondary | ICD-10-CM | POA: Diagnosis not present

## 2020-10-09 DIAGNOSIS — Z01818 Encounter for other preprocedural examination: Secondary | ICD-10-CM | POA: Diagnosis not present
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
## 2020-11-01 ENCOUNTER — Other Ambulatory Visit: Payer: Self-pay

## 2020-11-01 DIAGNOSIS — C50812 Malignant neoplasm of overlapping sites of left female breast: Secondary | ICD-10-CM

## 2020-11-01 DIAGNOSIS — Z170 Estrogen receptor positive status [ER+]: Secondary | ICD-10-CM

## 2020-11-02 ENCOUNTER — Inpatient Hospital Stay: Payer: BC Managed Care – PPO

## 2020-11-02 ENCOUNTER — Other Ambulatory Visit: Payer: Self-pay

## 2020-11-02 ENCOUNTER — Inpatient Hospital Stay: Payer: BC Managed Care – PPO | Attending: Hematology | Admitting: Hematology

## 2020-11-02 ENCOUNTER — Encounter: Payer: Self-pay | Admitting: Hematology

## 2020-11-02 VITALS — BP 115/88 | HR 81 | Temp 98.3°F | Resp 20 | Ht 66.0 in | Wt 160.8 lb

## 2020-11-02 DIAGNOSIS — Z79811 Long term (current) use of aromatase inhibitors: Secondary | ICD-10-CM | POA: Insufficient documentation

## 2020-11-02 DIAGNOSIS — F419 Anxiety disorder, unspecified: Secondary | ICD-10-CM | POA: Diagnosis not present

## 2020-11-02 DIAGNOSIS — C50812 Malignant neoplasm of overlapping sites of left female breast: Secondary | ICD-10-CM

## 2020-11-02 DIAGNOSIS — F32A Depression, unspecified: Secondary | ICD-10-CM | POA: Diagnosis not present

## 2020-11-02 DIAGNOSIS — Z170 Estrogen receptor positive status [ER+]: Secondary | ICD-10-CM

## 2020-11-02 DIAGNOSIS — Z79899 Other long term (current) drug therapy: Secondary | ICD-10-CM | POA: Insufficient documentation

## 2020-11-02 DIAGNOSIS — Z7901 Long term (current) use of anticoagulants: Secondary | ICD-10-CM | POA: Insufficient documentation

## 2020-11-02 DIAGNOSIS — Z86718 Personal history of other venous thrombosis and embolism: Secondary | ICD-10-CM

## 2020-11-02 LAB — CBC WITH DIFFERENTIAL (CANCER CENTER ONLY)
Abs Immature Granulocytes: 0.01 10*3/uL (ref 0.00–0.07)
Basophils Absolute: 0 10*3/uL (ref 0.0–0.1)
Basophils Relative: 1 %
Eosinophils Absolute: 0.1 10*3/uL (ref 0.0–0.5)
Eosinophils Relative: 4 %
HCT: 40.7 % (ref 36.0–46.0)
Hemoglobin: 13.5 g/dL (ref 12.0–15.0)
Immature Granulocytes: 0 %
Lymphocytes Relative: 25 %
Lymphs Abs: 0.8 10*3/uL (ref 0.7–4.0)
MCH: 30.5 pg (ref 26.0–34.0)
MCHC: 33.2 g/dL (ref 30.0–36.0)
MCV: 91.9 fL (ref 80.0–100.0)
Monocytes Absolute: 0.4 10*3/uL (ref 0.1–1.0)
Monocytes Relative: 13 %
Neutro Abs: 1.8 10*3/uL (ref 1.7–7.7)
Neutrophils Relative %: 57 %
Platelet Count: 221 10*3/uL (ref 150–400)
RBC: 4.43 MIL/uL (ref 3.87–5.11)
RDW: 13.2 % (ref 11.5–15.5)
WBC Count: 3.1 10*3/uL — ABNORMAL LOW (ref 4.0–10.5)
nRBC: 0 % (ref 0.0–0.2)

## 2020-11-02 LAB — CMP (CANCER CENTER ONLY)
ALT: 23 U/L (ref 0–44)
AST: 20 U/L (ref 15–41)
Albumin: 4.1 g/dL (ref 3.5–5.0)
Alkaline Phosphatase: 132 U/L — ABNORMAL HIGH (ref 38–126)
Anion gap: 8 (ref 5–15)
BUN: 15 mg/dL (ref 6–20)
CO2: 30 mmol/L (ref 22–32)
Calcium: 9.5 mg/dL (ref 8.9–10.3)
Chloride: 105 mmol/L (ref 98–111)
Creatinine: 0.96 mg/dL (ref 0.44–1.00)
GFR, Estimated: >60 mL/min (ref >60)
Glucose, Bld: 132 mg/dL — ABNORMAL HIGH (ref 70–99)
Potassium: 3.9 mmol/L (ref 3.5–5.1)
Sodium: 143 mmol/L (ref 135–145)
Total Bilirubin: 0.4 mg/dL (ref 0.3–1.2)
Total Protein: 7.2 g/dL (ref 6.5–8.1)

## 2020-11-02 MED ORDER — TAMOXIFEN CITRATE 20 MG PO TABS: 20.0000 mg | ORAL_TABLET | Freq: Every day | ORAL | 1 refills | Status: DC

## 2020-11-02 MED ORDER — BUPROPION HCL ER (SR) 100 MG PO TB12: 100.0000 mg | ORAL_TABLET | Freq: Two times a day (BID) | ORAL | 0 refills | Status: DC

## 2020-11-25 ENCOUNTER — Other Ambulatory Visit: Payer: Self-pay | Admitting: Hematology

## 2020-12-18 DIAGNOSIS — Z01812 Encounter for preprocedural laboratory examination: Secondary | ICD-10-CM | POA: Diagnosis not present

## 2020-12-21 DIAGNOSIS — K635 Polyp of colon: Secondary | ICD-10-CM | POA: Diagnosis not present

## 2020-12-21 DIAGNOSIS — Z1211 Encounter for screening for malignant neoplasm of colon: Secondary | ICD-10-CM | POA: Diagnosis not present

## 2021-01-08 ENCOUNTER — Encounter: Payer: Self-pay | Admitting: Hematology

## 2021-01-31 ENCOUNTER — Encounter: Payer: Self-pay | Admitting: Hematology

## 2021-01-31 ENCOUNTER — Telehealth: Payer: Self-pay | Admitting: Hematology

## 2021-01-31 NOTE — Telephone Encounter (Signed)
Scheduled per 03/17 scheduled message, patient is notified.

## 2021-02-04 ENCOUNTER — Inpatient Hospital Stay: Payer: BC Managed Care – PPO | Admitting: Hematology

## 2021-02-06 ENCOUNTER — Telehealth: Payer: Self-pay | Admitting: Hematology

## 2021-02-06 NOTE — Telephone Encounter (Signed)
Per sch msg, patient wanted to r/s 3/28 appt. Called and spoke with patient. Confirmed new date and time

## 2021-02-11 ENCOUNTER — Inpatient Hospital Stay: Payer: BC Managed Care – PPO

## 2021-02-11 ENCOUNTER — Inpatient Hospital Stay: Payer: BC Managed Care – PPO | Admitting: Hematology

## 2021-02-14 ENCOUNTER — Other Ambulatory Visit: Payer: BC Managed Care – PPO

## 2021-02-14 ENCOUNTER — Ambulatory Visit: Payer: BC Managed Care – PPO | Admitting: Hematology

## 2021-02-21 ENCOUNTER — Telehealth: Payer: Self-pay | Admitting: Hematology

## 2021-02-21 ENCOUNTER — Other Ambulatory Visit: Payer: Self-pay

## 2021-02-21 ENCOUNTER — Inpatient Hospital Stay: Payer: BC Managed Care – PPO | Attending: Hematology

## 2021-02-21 ENCOUNTER — Inpatient Hospital Stay (HOSPITAL_BASED_OUTPATIENT_CLINIC_OR_DEPARTMENT_OTHER): Payer: BC Managed Care – PPO | Admitting: Hematology

## 2021-02-21 ENCOUNTER — Encounter: Payer: Self-pay | Admitting: Hematology

## 2021-02-21 VITALS — BP 127/85 | HR 84 | Temp 97.9°F | Resp 19 | Ht 66.0 in | Wt 170.0 lb

## 2021-02-21 DIAGNOSIS — Z170 Estrogen receptor positive status [ER+]: Secondary | ICD-10-CM | POA: Insufficient documentation

## 2021-02-21 DIAGNOSIS — Z79810 Long term (current) use of selective estrogen receptor modulators (SERMs): Secondary | ICD-10-CM | POA: Diagnosis not present

## 2021-02-21 DIAGNOSIS — Z79899 Other long term (current) drug therapy: Secondary | ICD-10-CM | POA: Diagnosis not present

## 2021-02-21 DIAGNOSIS — Z86718 Personal history of other venous thrombosis and embolism: Secondary | ICD-10-CM | POA: Diagnosis not present

## 2021-02-21 DIAGNOSIS — Z7982 Long term (current) use of aspirin: Secondary | ICD-10-CM | POA: Diagnosis not present

## 2021-02-21 DIAGNOSIS — C50812 Malignant neoplasm of overlapping sites of left female breast: Secondary | ICD-10-CM

## 2021-02-21 LAB — CBC WITH DIFFERENTIAL (CANCER CENTER ONLY)
Abs Immature Granulocytes: 0.01 10*3/uL (ref 0.00–0.07)
Basophils Absolute: 0 10*3/uL (ref 0.0–0.1)
Basophils Relative: 1 %
Eosinophils Absolute: 0.1 10*3/uL (ref 0.0–0.5)
Eosinophils Relative: 2 %
HCT: 38.2 % (ref 36.0–46.0)
Hemoglobin: 12.9 g/dL (ref 12.0–15.0)
Immature Granulocytes: 0 %
Lymphocytes Relative: 25 %
Lymphs Abs: 0.9 10*3/uL (ref 0.7–4.0)
MCH: 31.2 pg (ref 26.0–34.0)
MCHC: 33.8 g/dL (ref 30.0–36.0)
MCV: 92.5 fL (ref 80.0–100.0)
Monocytes Absolute: 0.4 10*3/uL (ref 0.1–1.0)
Monocytes Relative: 10 %
Neutro Abs: 2.3 10*3/uL (ref 1.7–7.7)
Neutrophils Relative %: 62 %
Platelet Count: 222 10*3/uL (ref 150–400)
RBC: 4.13 MIL/uL (ref 3.87–5.11)
RDW: 13.4 % (ref 11.5–15.5)
WBC Count: 3.7 10*3/uL — ABNORMAL LOW (ref 4.0–10.5)
nRBC: 0 % (ref 0.0–0.2)

## 2021-02-21 LAB — CMP (CANCER CENTER ONLY)
ALT: 30 U/L (ref 0–44)
AST: 26 U/L (ref 15–41)
Albumin: 3.9 g/dL (ref 3.5–5.0)
Alkaline Phosphatase: 111 U/L (ref 38–126)
Anion gap: 10 (ref 5–15)
BUN: 17 mg/dL (ref 6–20)
CO2: 28 mmol/L (ref 22–32)
Calcium: 9 mg/dL (ref 8.9–10.3)
Chloride: 105 mmol/L (ref 98–111)
Creatinine: 0.9 mg/dL (ref 0.44–1.00)
GFR, Estimated: >60 mL/min (ref >60)
Glucose, Bld: 105 mg/dL — ABNORMAL HIGH (ref 70–99)
Potassium: 4.3 mmol/L (ref 3.5–5.1)
Sodium: 143 mmol/L (ref 135–145)
Total Bilirubin: 0.3 mg/dL (ref 0.3–1.2)
Total Protein: 7 g/dL (ref 6.5–8.1)

## 2021-02-21 MED ORDER — TAMOXIFEN CITRATE 10 MG PO TABS: 10.0000 mg | ORAL_TABLET | Freq: Every day | ORAL | 3 refills | Status: DC
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
## 2021-02-21 NOTE — Telephone Encounter (Signed)
Scheduled per los. Declined printout  

## 2021-04-22 DIAGNOSIS — L814 Other melanin hyperpigmentation: Secondary | ICD-10-CM | POA: Diagnosis not present

## 2021-04-22 DIAGNOSIS — D224 Melanocytic nevi of scalp and neck: Secondary | ICD-10-CM | POA: Diagnosis not present

## 2021-04-22 DIAGNOSIS — Z86018 Personal history of other benign neoplasm: Secondary | ICD-10-CM | POA: Diagnosis not present

## 2021-04-22 DIAGNOSIS — D225 Melanocytic nevi of trunk: Secondary | ICD-10-CM | POA: Diagnosis not present

## 2021-04-24 DIAGNOSIS — Z1231 Encounter for screening mammogram for malignant neoplasm of breast: Secondary | ICD-10-CM | POA: Diagnosis not present

## 2021-04-24 DIAGNOSIS — C50912 Malignant neoplasm of unspecified site of left female breast: Secondary | ICD-10-CM | POA: Diagnosis not present

## 2021-04-24 DIAGNOSIS — Z9012 Acquired absence of left breast and nipple: Secondary | ICD-10-CM | POA: Diagnosis not present

## 2021-04-24 DIAGNOSIS — C773 Secondary and unspecified malignant neoplasm of axilla and upper limb lymph nodes: Secondary | ICD-10-CM | POA: Diagnosis not present

## 2021-05-06 ENCOUNTER — Other Ambulatory Visit: Payer: BC Managed Care – PPO

## 2021-05-06 ENCOUNTER — Ambulatory Visit: Payer: BC Managed Care – PPO | Admitting: Hematology

## 2021-05-07 ENCOUNTER — Telehealth: Payer: Self-pay | Admitting: Hematology

## 2021-05-07 NOTE — Telephone Encounter (Signed)
Sch per 4/7 sch msg, pt aware.

## 2021-05-13 DIAGNOSIS — Z01419 Encounter for gynecological examination (general) (routine) without abnormal findings: Secondary | ICD-10-CM | POA: Diagnosis not present

## 2021-05-13 DIAGNOSIS — R87610 Atypical squamous cells of undetermined significance on cytologic smear of cervix (ASC-US): Secondary | ICD-10-CM | POA: Diagnosis not present

## 2021-05-13 DIAGNOSIS — Z6827 Body mass index (BMI) 27.0-27.9, adult: Secondary | ICD-10-CM | POA: Diagnosis not present

## 2021-05-30 ENCOUNTER — Ambulatory Visit: Payer: BC Managed Care – PPO | Admitting: Hematology

## 2021-05-30 ENCOUNTER — Ambulatory Visit (HOSPITAL_BASED_OUTPATIENT_CLINIC_OR_DEPARTMENT_OTHER): Payer: BC Managed Care – PPO | Admitting: Hematology

## 2021-05-30 DIAGNOSIS — Z170 Estrogen receptor positive status [ER+]: Secondary | ICD-10-CM | POA: Diagnosis not present

## 2021-05-30 DIAGNOSIS — C50812 Malignant neoplasm of overlapping sites of left female breast: Secondary | ICD-10-CM | POA: Diagnosis not present

## 2021-05-30 MED ORDER — TAMOXIFEN CITRATE 10 MG PO TABS: 10.0000 mg | ORAL_TABLET | Freq: Every day | ORAL | 1 refills | Status: DC
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
## 2021-06-12 ENCOUNTER — Encounter: Payer: Self-pay | Admitting: Hematology

## 2021-06-13 ENCOUNTER — Other Ambulatory Visit: Payer: Self-pay | Admitting: Nurse Practitioner

## 2021-06-13 MED ORDER — ESCITALOPRAM OXALATE 20 MG PO TABS: 20.0000 mg | ORAL_TABLET | Freq: Every day | ORAL | 0 refills | Status: DC

## 2021-08-19 ENCOUNTER — Encounter: Payer: Self-pay | Admitting: Hematology

## 2021-08-20 ENCOUNTER — Other Ambulatory Visit: Payer: Self-pay | Admitting: Nurse Practitioner

## 2021-08-20 DIAGNOSIS — Z13220 Encounter for screening for lipoid disorders: Secondary | ICD-10-CM

## 2021-08-22 ENCOUNTER — Encounter: Payer: Self-pay | Admitting: Hematology

## 2021-08-22 ENCOUNTER — Inpatient Hospital Stay: Payer: BC Managed Care – PPO | Attending: Hematology | Admitting: Hematology

## 2021-08-22 ENCOUNTER — Inpatient Hospital Stay: Payer: BC Managed Care – PPO

## 2021-08-22 ENCOUNTER — Other Ambulatory Visit: Payer: Self-pay

## 2021-08-22 ENCOUNTER — Telehealth: Payer: Self-pay | Admitting: *Deleted

## 2021-08-22 VITALS — BP 123/85 | HR 79 | Temp 98.4°F | Resp 18 | Ht 66.0 in | Wt 167.0 lb

## 2021-08-22 DIAGNOSIS — C773 Secondary and unspecified malignant neoplasm of axilla and upper limb lymph nodes: Secondary | ICD-10-CM | POA: Diagnosis not present

## 2021-08-22 DIAGNOSIS — Z86718 Personal history of other venous thrombosis and embolism: Secondary | ICD-10-CM

## 2021-08-22 DIAGNOSIS — C50812 Malignant neoplasm of overlapping sites of left female breast: Secondary | ICD-10-CM | POA: Diagnosis not present

## 2021-08-22 DIAGNOSIS — Z13220 Encounter for screening for lipoid disorders: Secondary | ICD-10-CM

## 2021-08-22 DIAGNOSIS — Z79810 Long term (current) use of selective estrogen receptor modulators (SERMs): Secondary | ICD-10-CM | POA: Insufficient documentation

## 2021-08-22 DIAGNOSIS — Z170 Estrogen receptor positive status [ER+]: Secondary | ICD-10-CM | POA: Insufficient documentation

## 2021-08-22 LAB — CMP (CANCER CENTER ONLY)
ALT: 20 U/L (ref 0–44)
AST: 21 U/L (ref 15–41)
Albumin: 4.1 g/dL (ref 3.5–5.0)
Alkaline Phosphatase: 91 U/L (ref 38–126)
Anion gap: 10 (ref 5–15)
BUN: 19 mg/dL (ref 6–20)
CO2: 26 mmol/L (ref 22–32)
Calcium: 9.4 mg/dL (ref 8.9–10.3)
Chloride: 106 mmol/L (ref 98–111)
Creatinine: 0.85 mg/dL (ref 0.44–1.00)
GFR, Estimated: >60 mL/min (ref >60)
Glucose, Bld: 98 mg/dL (ref 70–99)
Potassium: 4.3 mmol/L (ref 3.5–5.1)
Sodium: 142 mmol/L (ref 135–145)
Total Bilirubin: 0.4 mg/dL (ref 0.3–1.2)
Total Protein: 7.1 g/dL (ref 6.5–8.1)

## 2021-08-22 LAB — LIPID PANEL
Cholesterol: 274 mg/dL — ABNORMAL HIGH (ref 0–200)
HDL: 82 mg/dL (ref >40)
LDL Cholesterol: 171 mg/dL — ABNORMAL HIGH (ref 0–99)
Total CHOL/HDL Ratio: 3.3 RATIO
Triglycerides: 103 mg/dL (ref <150)
VLDL: 21 mg/dL (ref 0–40)

## 2021-08-22 LAB — CBC WITH DIFFERENTIAL (CANCER CENTER ONLY)
Abs Immature Granulocytes: 0.01 10*3/uL (ref 0.00–0.07)
Basophils Absolute: 0 10*3/uL (ref 0.0–0.1)
Basophils Relative: 1 %
Eosinophils Absolute: 0.1 10*3/uL (ref 0.0–0.5)
Eosinophils Relative: 3 %
HCT: 38 % (ref 36.0–46.0)
Hemoglobin: 12.7 g/dL (ref 12.0–15.0)
Immature Granulocytes: 0 %
Lymphocytes Relative: 28 %
Lymphs Abs: 1 10*3/uL (ref 0.7–4.0)
MCH: 30.1 pg (ref 26.0–34.0)
MCHC: 33.4 g/dL (ref 30.0–36.0)
MCV: 90 fL (ref 80.0–100.0)
Monocytes Absolute: 0.4 10*3/uL (ref 0.1–1.0)
Monocytes Relative: 10 %
Neutro Abs: 2.2 10*3/uL (ref 1.7–7.7)
Neutrophils Relative %: 58 %
Platelet Count: 212 10*3/uL (ref 150–400)
RBC: 4.22 MIL/uL (ref 3.87–5.11)
RDW: 12.8 % (ref 11.5–15.5)
WBC Count: 3.6 10*3/uL — ABNORMAL LOW (ref 4.0–10.5)
nRBC: 0 % (ref 0.0–0.2)

## 2021-08-22 NOTE — Telephone Encounter (Signed)
-----   Message from Alla Feeling, NP sent at 08/22/2021  1:21 PM EDT ----- Please fax lipid panel to PCP, Thanks, Regan Rakers, NP
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
## 2021-08-23 LAB — CANCER ANTIGEN 27.29: CA 27.29: 9.1 U/mL (ref 0.0–38.6)

## 2021-08-24 ENCOUNTER — Encounter: Payer: Self-pay | Admitting: Hematology

## 2021-09-09 ENCOUNTER — Other Ambulatory Visit: Payer: Self-pay | Admitting: Nurse Practitioner

## 2021-10-28 ENCOUNTER — Encounter: Payer: Self-pay | Admitting: Hematology

## 2021-10-29 ENCOUNTER — Encounter: Payer: Self-pay | Admitting: Hematology

## 2021-10-29 DIAGNOSIS — G479 Sleep disorder, unspecified: Secondary | ICD-10-CM | POA: Diagnosis not present

## 2021-10-29 DIAGNOSIS — C773 Secondary and unspecified malignant neoplasm of axilla and upper limb lymph nodes: Secondary | ICD-10-CM | POA: Diagnosis not present

## 2021-10-29 DIAGNOSIS — R232 Flushing: Secondary | ICD-10-CM | POA: Diagnosis not present

## 2021-10-29 DIAGNOSIS — C50912 Malignant neoplasm of unspecified site of left female breast: Secondary | ICD-10-CM | POA: Diagnosis not present

## 2021-10-29 DIAGNOSIS — L659 Nonscarring hair loss, unspecified: Secondary | ICD-10-CM | POA: Diagnosis not present

## 2021-10-29 NOTE — Telephone Encounter (Signed)
I called pt back.  She has developed worsening fatigue, hot flashes and some hair loss lately, which has impacted her daily activities.  This is likely related to her tamoxifen.  She has been off for 5 days now.  She is not ready to stop tamoxifen completely.  I recommend her to be off for additional 2 weeks continue after Christmas, and restart at 10 mg daily, but take 7 days off every month, to see if she can tolerate better.  She agrees with the plan. She is on Lexapro 10mg  daily already.   She is also scheduled to see [facility], I I encouraged her to try acupuncture, diet supplemented etc., as long as it has no interaction with tamoxifen, to see if her symptoms improve.  Would also check TSH to rule out hypothyroidism. She agrees.   Truitt Merle  10/29/2021

## 2021-10-31 DIAGNOSIS — R922 Inconclusive mammogram: Secondary | ICD-10-CM | POA: Diagnosis not present

## 2021-10-31 DIAGNOSIS — Z9012 Acquired absence of left breast and nipple: Secondary | ICD-10-CM | POA: Diagnosis not present

## 2021-10-31 DIAGNOSIS — Z853 Personal history of malignant neoplasm of breast: Secondary | ICD-10-CM | POA: Diagnosis not present

## 2021-10-31 DIAGNOSIS — C50912 Malignant neoplasm of unspecified site of left female breast: Secondary | ICD-10-CM | POA: Diagnosis not present

## 2021-10-31 DIAGNOSIS — C773 Secondary and unspecified malignant neoplasm of axilla and upper limb lymph nodes: Secondary | ICD-10-CM | POA: Diagnosis not present

## 2021-11-07 DIAGNOSIS — M25512 Pain in left shoulder: Secondary | ICD-10-CM | POA: Diagnosis not present

## 2021-11-07 DIAGNOSIS — Z4889 Encounter for other specified surgical aftercare: Secondary | ICD-10-CM | POA: Diagnosis not present

## 2021-11-07 DIAGNOSIS — C50912 Malignant neoplasm of unspecified site of left female breast: Secondary | ICD-10-CM | POA: Diagnosis not present

## 2021-11-07 DIAGNOSIS — C773 Secondary and unspecified malignant neoplasm of axilla and upper limb lymph nodes: Secondary | ICD-10-CM | POA: Diagnosis not present

## 2021-11-27 DIAGNOSIS — G9339 Other post infection and related fatigue syndromes: Secondary | ICD-10-CM | POA: Diagnosis not present

## 2021-11-27 DIAGNOSIS — Z713 Dietary counseling and surveillance: Secondary | ICD-10-CM | POA: Diagnosis not present

## 2021-11-27 DIAGNOSIS — Z7182 Exercise counseling: Secondary | ICD-10-CM | POA: Diagnosis not present

## 2021-11-27 DIAGNOSIS — R5383 Other fatigue: Secondary | ICD-10-CM | POA: Diagnosis not present

## 2021-11-27 DIAGNOSIS — Z6828 Body mass index (BMI) 28.0-28.9, adult: Secondary | ICD-10-CM | POA: Diagnosis not present

## 2021-11-28 DIAGNOSIS — R531 Weakness: Secondary | ICD-10-CM | POA: Diagnosis not present

## 2021-11-28 DIAGNOSIS — M25512 Pain in left shoulder: Secondary | ICD-10-CM | POA: Diagnosis not present

## 2021-11-28 DIAGNOSIS — M25612 Stiffness of left shoulder, not elsewhere classified: Secondary | ICD-10-CM | POA: Diagnosis not present

## 2021-12-09 DIAGNOSIS — R531 Weakness: Secondary | ICD-10-CM | POA: Diagnosis not present

## 2021-12-09 DIAGNOSIS — M25612 Stiffness of left shoulder, not elsewhere classified: Secondary | ICD-10-CM | POA: Diagnosis not present

## 2021-12-09 DIAGNOSIS — M25512 Pain in left shoulder: Secondary | ICD-10-CM | POA: Diagnosis not present

## 2021-12-13 ENCOUNTER — Other Ambulatory Visit: Payer: Self-pay | Admitting: Hematology

## 2021-12-16 DIAGNOSIS — R531 Weakness: Secondary | ICD-10-CM | POA: Diagnosis not present

## 2021-12-16 DIAGNOSIS — M25612 Stiffness of left shoulder, not elsewhere classified: Secondary | ICD-10-CM | POA: Diagnosis not present

## 2021-12-16 DIAGNOSIS — M25512 Pain in left shoulder: Secondary | ICD-10-CM | POA: Diagnosis not present

## 2021-12-23 DIAGNOSIS — M25512 Pain in left shoulder: Secondary | ICD-10-CM | POA: Diagnosis not present

## 2021-12-23 DIAGNOSIS — Z483 Aftercare following surgery for neoplasm: Secondary | ICD-10-CM | POA: Diagnosis not present

## 2021-12-23 DIAGNOSIS — C50912 Malignant neoplasm of unspecified site of left female breast: Secondary | ICD-10-CM | POA: Diagnosis not present

## 2021-12-23 DIAGNOSIS — C773 Secondary and unspecified malignant neoplasm of axilla and upper limb lymph nodes: Secondary | ICD-10-CM | POA: Diagnosis not present

## 2021-12-30 DIAGNOSIS — Z483 Aftercare following surgery for neoplasm: Secondary | ICD-10-CM | POA: Diagnosis not present

## 2021-12-30 DIAGNOSIS — C50912 Malignant neoplasm of unspecified site of left female breast: Secondary | ICD-10-CM | POA: Diagnosis not present

## 2021-12-30 DIAGNOSIS — C773 Secondary and unspecified malignant neoplasm of axilla and upper limb lymph nodes: Secondary | ICD-10-CM | POA: Diagnosis not present

## 2021-12-30 DIAGNOSIS — M25512 Pain in left shoulder: Secondary | ICD-10-CM | POA: Diagnosis not present

## 2022-01-06 DIAGNOSIS — Z483 Aftercare following surgery for neoplasm: Secondary | ICD-10-CM | POA: Diagnosis not present

## 2022-01-06 DIAGNOSIS — C773 Secondary and unspecified malignant neoplasm of axilla and upper limb lymph nodes: Secondary | ICD-10-CM | POA: Diagnosis not present

## 2022-01-06 DIAGNOSIS — C50912 Malignant neoplasm of unspecified site of left female breast: Secondary | ICD-10-CM | POA: Diagnosis not present

## 2022-01-06 DIAGNOSIS — M25512 Pain in left shoulder: Secondary | ICD-10-CM | POA: Diagnosis not present

## 2022-02-05 DIAGNOSIS — R29898 Other symptoms and signs involving the musculoskeletal system: Secondary | ICD-10-CM | POA: Diagnosis not present

## 2022-02-05 DIAGNOSIS — M25512 Pain in left shoulder: Secondary | ICD-10-CM | POA: Diagnosis not present

## 2022-02-05 DIAGNOSIS — C50912 Malignant neoplasm of unspecified site of left female breast: Secondary | ICD-10-CM | POA: Diagnosis not present

## 2022-02-05 DIAGNOSIS — M6281 Muscle weakness (generalized): Secondary | ICD-10-CM | POA: Diagnosis not present

## 2022-02-17 ENCOUNTER — Telehealth: Payer: Self-pay | Admitting: Hematology

## 2022-02-17 NOTE — Telephone Encounter (Signed)
Left pt a message about reschedule. Left call back number if changes are needed. ?

## 2022-03-03 DIAGNOSIS — S83411A Sprain of medial collateral ligament of right knee, initial encounter: Secondary | ICD-10-CM | POA: Diagnosis not present

## 2022-03-05 ENCOUNTER — Inpatient Hospital Stay: Payer: BC Managed Care – PPO | Admitting: Hematology

## 2022-03-05 ENCOUNTER — Inpatient Hospital Stay: Payer: BC Managed Care – PPO

## 2022-03-18 ENCOUNTER — Encounter: Payer: Self-pay | Admitting: Hematology

## 2022-03-18 MED ORDER — PAXLOVID (300/100) 20 X 150 MG & 10 X 100MG PO TBPK: 3.0000 | ORAL_TABLET | Freq: Two times a day (BID) | ORAL | 0 refills | Status: DC

## 2022-03-19 ENCOUNTER — Telehealth: Payer: Self-pay | Admitting: Hematology

## 2022-03-19 NOTE — Telephone Encounter (Signed)
.  Called patient to schedule appointment per 5/3 inbasket, patient is aware of date and time.   ?

## 2022-03-20 ENCOUNTER — Other Ambulatory Visit: Payer: Self-pay

## 2022-03-21 ENCOUNTER — Inpatient Hospital Stay: Payer: BC Managed Care – PPO | Admitting: Hematology

## 2022-03-21 ENCOUNTER — Telehealth: Payer: Self-pay

## 2022-03-21 ENCOUNTER — Inpatient Hospital Stay: Payer: BC Managed Care – PPO

## 2022-03-21 NOTE — Telephone Encounter (Signed)
Tried calling the pt to f/u on COVID symptoms but got no answer; therefore, sent patient a MyChart message.  ?

## 2022-04-22 DIAGNOSIS — D225 Melanocytic nevi of trunk: Secondary | ICD-10-CM | POA: Diagnosis not present

## 2022-04-22 DIAGNOSIS — L578 Other skin changes due to chronic exposure to nonionizing radiation: Secondary | ICD-10-CM | POA: Diagnosis not present

## 2022-04-22 DIAGNOSIS — D224 Melanocytic nevi of scalp and neck: Secondary | ICD-10-CM | POA: Diagnosis not present

## 2022-04-22 DIAGNOSIS — L821 Other seborrheic keratosis: Secondary | ICD-10-CM | POA: Diagnosis not present

## 2022-04-23 DIAGNOSIS — M6751 Plica syndrome, right knee: Secondary | ICD-10-CM | POA: Diagnosis not present

## 2022-04-23 DIAGNOSIS — C50912 Malignant neoplasm of unspecified site of left female breast: Secondary | ICD-10-CM | POA: Diagnosis not present

## 2022-04-23 DIAGNOSIS — M25561 Pain in right knee: Secondary | ICD-10-CM | POA: Diagnosis not present

## 2022-04-28 DIAGNOSIS — C50912 Malignant neoplasm of unspecified site of left female breast: Secondary | ICD-10-CM | POA: Diagnosis not present

## 2022-04-28 DIAGNOSIS — C773 Secondary and unspecified malignant neoplasm of axilla and upper limb lymph nodes: Secondary | ICD-10-CM | POA: Diagnosis not present

## 2022-04-28 DIAGNOSIS — R922 Inconclusive mammogram: Secondary | ICD-10-CM | POA: Diagnosis not present

## 2022-04-28 DIAGNOSIS — Z1231 Encounter for screening mammogram for malignant neoplasm of breast: Secondary | ICD-10-CM | POA: Diagnosis not present

## 2022-05-01 ENCOUNTER — Other Ambulatory Visit: Payer: Self-pay

## 2022-05-01 DIAGNOSIS — Z170 Estrogen receptor positive status [ER+]: Secondary | ICD-10-CM

## 2022-05-02 ENCOUNTER — Encounter: Payer: Self-pay | Admitting: Hematology

## 2022-05-02 ENCOUNTER — Inpatient Hospital Stay: Payer: BC Managed Care – PPO | Attending: Hematology

## 2022-05-02 ENCOUNTER — Inpatient Hospital Stay (HOSPITAL_BASED_OUTPATIENT_CLINIC_OR_DEPARTMENT_OTHER): Payer: BC Managed Care – PPO | Admitting: Hematology

## 2022-05-02 ENCOUNTER — Other Ambulatory Visit: Payer: Self-pay

## 2022-05-02 VITALS — BP 115/73 | HR 76 | Temp 98.4°F | Resp 17 | Ht 66.0 in | Wt 170.6 lb

## 2022-05-02 DIAGNOSIS — C50812 Malignant neoplasm of overlapping sites of left female breast: Secondary | ICD-10-CM

## 2022-05-02 DIAGNOSIS — Z170 Estrogen receptor positive status [ER+]: Secondary | ICD-10-CM

## 2022-05-02 DIAGNOSIS — Z79810 Long term (current) use of selective estrogen receptor modulators (SERMs): Secondary | ICD-10-CM | POA: Insufficient documentation

## 2022-05-02 DIAGNOSIS — Z79899 Other long term (current) drug therapy: Secondary | ICD-10-CM | POA: Insufficient documentation

## 2022-05-02 LAB — CMP (CANCER CENTER ONLY)
ALT: 36 U/L (ref 0–44)
AST: 24 U/L (ref 15–41)
Albumin: 4.4 g/dL (ref 3.5–5.0)
Alkaline Phosphatase: 88 U/L (ref 38–126)
Anion gap: 6 (ref 5–15)
BUN: 18 mg/dL (ref 6–20)
CO2: 29 mmol/L (ref 22–32)
Calcium: 9.7 mg/dL (ref 8.9–10.3)
Chloride: 105 mmol/L (ref 98–111)
Creatinine: 0.93 mg/dL (ref 0.44–1.00)
GFR, Estimated: >60 mL/min
Glucose, Bld: 100 mg/dL — ABNORMAL HIGH (ref 70–99)
Potassium: 4.2 mmol/L (ref 3.5–5.1)
Sodium: 140 mmol/L (ref 135–145)
Total Bilirubin: 0.3 mg/dL (ref 0.3–1.2)
Total Protein: 7.3 g/dL (ref 6.5–8.1)

## 2022-05-02 LAB — CBC WITH DIFFERENTIAL (CANCER CENTER ONLY)
Abs Immature Granulocytes: 0.01 10*3/uL (ref 0.00–0.07)
Basophils Absolute: 0 10*3/uL (ref 0.0–0.1)
Basophils Relative: 1 %
Eosinophils Absolute: 0.1 10*3/uL (ref 0.0–0.5)
Eosinophils Relative: 2 %
HCT: 38.8 % (ref 36.0–46.0)
Hemoglobin: 13.1 g/dL (ref 12.0–15.0)
Immature Granulocytes: 0 %
Lymphocytes Relative: 35 %
Lymphs Abs: 1.5 10*3/uL (ref 0.7–4.0)
MCH: 30.1 pg (ref 26.0–34.0)
MCHC: 33.8 g/dL (ref 30.0–36.0)
MCV: 89.2 fL (ref 80.0–100.0)
Monocytes Absolute: 0.5 10*3/uL (ref 0.1–1.0)
Monocytes Relative: 12 %
Neutro Abs: 2.1 10*3/uL (ref 1.7–7.7)
Neutrophils Relative %: 50 %
Platelet Count: 236 10*3/uL (ref 150–400)
RBC: 4.35 MIL/uL (ref 3.87–5.11)
RDW: 13.4 % (ref 11.5–15.5)
WBC Count: 4.2 10*3/uL (ref 4.0–10.5)
nRBC: 0 % (ref 0.0–0.2)
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
## 2022-05-03 LAB — CANCER ANTIGEN 27.29: CA 27.29: 13.7 U/mL (ref 0.0–38.6)

## 2022-05-15 DIAGNOSIS — Z01419 Encounter for gynecological examination (general) (routine) without abnormal findings: Secondary | ICD-10-CM | POA: Diagnosis not present

## 2022-05-15 DIAGNOSIS — Z6828 Body mass index (BMI) 28.0-28.9, adult: Secondary | ICD-10-CM | POA: Diagnosis not present

## 2022-06-09 ENCOUNTER — Other Ambulatory Visit: Payer: Self-pay

## 2022-06-09 DIAGNOSIS — M23261 Derangement of other lateral meniscus due to old tear or injury, right knee: Secondary | ICD-10-CM | POA: Diagnosis not present

## 2022-06-09 DIAGNOSIS — M6751 Plica syndrome, right knee: Secondary | ICD-10-CM | POA: Diagnosis not present

## 2022-06-09 DIAGNOSIS — R600 Localized edema: Secondary | ICD-10-CM | POA: Diagnosis not present

## 2022-06-18 DIAGNOSIS — M222X1 Patellofemoral disorders, right knee: Secondary | ICD-10-CM | POA: Diagnosis not present

## 2022-06-18 DIAGNOSIS — M25561 Pain in right knee: Secondary | ICD-10-CM | POA: Diagnosis not present

## 2022-07-18 ENCOUNTER — Other Ambulatory Visit: Payer: Self-pay

## 2022-10-06 ENCOUNTER — Other Ambulatory Visit: Payer: Self-pay | Admitting: Nurse Practitioner
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
## 2022-10-19 NOTE — Assessment & Plan Note (Signed)
-  Stage IB, pmT2N1aM0, ER+/PR+, HER2-, Grade I-II, RS 25 -diagnosed in 05/2019, s/p left mastectomy on 06/24/19, 4 cycles of adjuvant TC, left nipple resection surgery on 11/22/19 and Adjuvant Radiation 12/26/19-02/20/20.  -she started antiestrogen therapy  AI(anastrozole first, then exemestane) on 03/17/20, did not tolerated well and switched to tamoxifen in January 2022. -Tamoxifen dose reduced to 87m daily in April 2022 due to side effects -She reported memory loss, fatigue, and insomnia with intermittent Tamoxifen
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
## 2022-10-20 ENCOUNTER — Inpatient Hospital Stay (HOSPITAL_BASED_OUTPATIENT_CLINIC_OR_DEPARTMENT_OTHER): Payer: BC Managed Care – PPO | Admitting: Hematology

## 2022-10-20 ENCOUNTER — Other Ambulatory Visit: Payer: Self-pay

## 2022-10-20 ENCOUNTER — Inpatient Hospital Stay: Payer: BC Managed Care – PPO | Attending: Hematology

## 2022-10-20 ENCOUNTER — Encounter: Payer: Self-pay | Admitting: Hematology

## 2022-10-20 VITALS — BP 131/108 | HR 98 | Temp 98.3°F | Resp 15 | Wt 171.9 lb

## 2022-10-20 DIAGNOSIS — Z170 Estrogen receptor positive status [ER+]: Secondary | ICD-10-CM | POA: Diagnosis not present

## 2022-10-20 DIAGNOSIS — C50812 Malignant neoplasm of overlapping sites of left female breast: Secondary | ICD-10-CM | POA: Diagnosis not present

## 2022-10-20 DIAGNOSIS — Z86718 Personal history of other venous thrombosis and embolism: Secondary | ICD-10-CM

## 2022-10-20 DIAGNOSIS — Z79810 Long term (current) use of selective estrogen receptor modulators (SERMs): Secondary | ICD-10-CM | POA: Diagnosis not present

## 2022-10-20 LAB — CMP (CANCER CENTER ONLY)
ALT: 40 U/L (ref 0–44)
AST: 23 U/L (ref 15–41)
Albumin: 4.7 g/dL (ref 3.5–5.0)
Alkaline Phosphatase: 96 U/L (ref 38–126)
Anion gap: 7 (ref 5–15)
BUN: 17 mg/dL (ref 6–20)
CO2: 30 mmol/L (ref 22–32)
Calcium: 9.8 mg/dL (ref 8.9–10.3)
Chloride: 105 mmol/L (ref 98–111)
Creatinine: 0.93 mg/dL (ref 0.44–1.00)
GFR, Estimated: >60 mL/min (ref >60)
Glucose, Bld: 98 mg/dL (ref 70–99)
Potassium: 4.2 mmol/L (ref 3.5–5.1)
Sodium: 142 mmol/L (ref 135–145)
Total Bilirubin: 0.4 mg/dL (ref 0.3–1.2)
Total Protein: 7.5 g/dL (ref 6.5–8.1)

## 2022-10-20 LAB — CBC WITH DIFFERENTIAL (CANCER CENTER ONLY)
Abs Immature Granulocytes: 0.01 10*3/uL (ref 0.00–0.07)
Basophils Absolute: 0 10*3/uL (ref 0.0–0.1)
Basophils Relative: 1 %
Eosinophils Absolute: 0.1 10*3/uL (ref 0.0–0.5)
Eosinophils Relative: 2 %
HCT: 39.1 % (ref 36.0–46.0)
Hemoglobin: 13.3 g/dL (ref 12.0–15.0)
Immature Granulocytes: 0 %
Lymphocytes Relative: 27 %
Lymphs Abs: 1.1 10*3/uL (ref 0.7–4.0)
MCH: 30.7 pg (ref 26.0–34.0)
MCHC: 34 g/dL (ref 30.0–36.0)
MCV: 90.3 fL (ref 80.0–100.0)
Monocytes Absolute: 0.4 10*3/uL (ref 0.1–1.0)
Monocytes Relative: 10 %
Neutro Abs: 2.4 10*3/uL (ref 1.7–7.7)
Neutrophils Relative %: 60 %
Platelet Count: 244 10*3/uL (ref 150–400)
RBC: 4.33 MIL/uL (ref 3.87–5.11)
RDW: 13.3 % (ref 11.5–15.5)
WBC Count: 4.1 10*3/uL (ref 4.0–10.5)
nRBC: 0 % (ref 0.0–0.2)

## 2022-10-30 DIAGNOSIS — C50912 Malignant neoplasm of unspecified site of left female breast: Secondary | ICD-10-CM | POA: Diagnosis not present

## 2022-10-30 DIAGNOSIS — C773 Secondary and unspecified malignant neoplasm of axilla and upper limb lymph nodes: Secondary | ICD-10-CM | POA: Diagnosis not present

## 2022-10-30 DIAGNOSIS — Z853 Personal history of malignant neoplasm of breast: Secondary | ICD-10-CM | POA: Diagnosis not present

## 2022-10-30 DIAGNOSIS — R9230 Dense breasts, unspecified: Secondary | ICD-10-CM | POA: Diagnosis not present

## 2023-04-27 ENCOUNTER — Other Ambulatory Visit: Payer: Self-pay

## 2023-04-27 ENCOUNTER — Inpatient Hospital Stay (HOSPITAL_BASED_OUTPATIENT_CLINIC_OR_DEPARTMENT_OTHER): Payer: BC Managed Care – PPO | Admitting: Hematology

## 2023-04-27 ENCOUNTER — Encounter: Payer: Self-pay | Admitting: Hematology

## 2023-04-27 ENCOUNTER — Inpatient Hospital Stay: Payer: BC Managed Care – PPO | Attending: Hematology

## 2023-04-27 VITALS — BP 115/79 | HR 66 | Temp 97.9°F | Resp 18 | Ht 66.0 in | Wt 165.5 lb

## 2023-04-27 DIAGNOSIS — Z170 Estrogen receptor positive status [ER+]: Secondary | ICD-10-CM | POA: Diagnosis not present

## 2023-04-27 DIAGNOSIS — Z923 Personal history of irradiation: Secondary | ICD-10-CM | POA: Insufficient documentation

## 2023-04-27 DIAGNOSIS — C50812 Malignant neoplasm of overlapping sites of left female breast: Secondary | ICD-10-CM | POA: Diagnosis not present

## 2023-04-27 DIAGNOSIS — Z86718 Personal history of other venous thrombosis and embolism: Secondary | ICD-10-CM | POA: Diagnosis not present

## 2023-04-27 DIAGNOSIS — Z7982 Long term (current) use of aspirin: Secondary | ICD-10-CM | POA: Insufficient documentation

## 2023-04-27 DIAGNOSIS — Z853 Personal history of malignant neoplasm of breast: Secondary | ICD-10-CM | POA: Insufficient documentation

## 2023-04-27 LAB — CBC WITH DIFFERENTIAL (CANCER CENTER ONLY)
Abs Immature Granulocytes: 0 10*3/uL (ref 0.00–0.07)
Basophils Absolute: 0 10*3/uL (ref 0.0–0.1)
Basophils Relative: 1 %
Eosinophils Absolute: 0.1 10*3/uL (ref 0.0–0.5)
Eosinophils Relative: 2 %
HCT: 39.6 % (ref 36.0–46.0)
Hemoglobin: 13.5 g/dL (ref 12.0–15.0)
Immature Granulocytes: 0 %
Lymphocytes Relative: 29 %
Lymphs Abs: 1.1 10*3/uL (ref 0.7–4.0)
MCH: 30.8 pg (ref 26.0–34.0)
MCHC: 34.1 g/dL (ref 30.0–36.0)
MCV: 90.2 fL (ref 80.0–100.0)
Monocytes Absolute: 0.4 10*3/uL (ref 0.1–1.0)
Monocytes Relative: 11 %
Neutro Abs: 2.1 10*3/uL (ref 1.7–7.7)
Neutrophils Relative %: 57 %
Platelet Count: 220 10*3/uL (ref 150–400)
RBC: 4.39 MIL/uL (ref 3.87–5.11)
RDW: 13.2 % (ref 11.5–15.5)
WBC Count: 3.6 10*3/uL — ABNORMAL LOW (ref 4.0–10.5)
nRBC: 0 % (ref 0.0–0.2)

## 2023-04-27 LAB — CMP (CANCER CENTER ONLY)
ALT: 20 U/L (ref 0–44)
AST: 18 U/L (ref 15–41)
Albumin: 4.4 g/dL (ref 3.5–5.0)
Alkaline Phosphatase: 95 U/L (ref 38–126)
Anion gap: 5 (ref 5–15)
BUN: 16 mg/dL (ref 6–20)
CO2: 31 mmol/L (ref 22–32)
Calcium: 9.3 mg/dL (ref 8.9–10.3)
Chloride: 105 mmol/L (ref 98–111)
Creatinine: 0.91 mg/dL (ref 0.44–1.00)
GFR, Estimated: >60 mL/min (ref >60)
Glucose, Bld: 101 mg/dL — ABNORMAL HIGH (ref 70–99)
Potassium: 4 mmol/L (ref 3.5–5.1)
Sodium: 141 mmol/L (ref 135–145)
Total Bilirubin: 0.4 mg/dL (ref 0.3–1.2)
Total Protein: 7.2 g/dL (ref 6.5–8.1)
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
## 2023-04-27 NOTE — Assessment & Plan Note (Signed)
-  Stage IB, pmT2N1aM0, ER+/PR+, HER2-, Grade I-II, RS 25 -diagnosed in 05/2019, s/p left mastectomy on 06/24/19, 4 cycles of adjuvant TC, left nipple resection surgery on 11/22/19 and Adjuvant Radiation 12/26/19-02/20/20.  -she started antiestrogen therapy  (Aanastrozole first, then exemestane) on 03/17/20, did not tolerated well and switched to tamoxifen in January 2022. -Tamoxifen dose reduced to 10mg  daily in April 2022 due to side effects -She reported memory loss, fatigue, and insomnia with intermittent Tamoxifen and Tamoxifen was stopped in June 2023
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
## 2023-04-28 LAB — CANCER ANTIGEN 27.29: CA 27.29: 11.5 U/mL (ref 0.0–38.6)

## 2023-05-13 ENCOUNTER — Other Ambulatory Visit: Payer: Self-pay

## 2023-06-17 NOTE — Progress Notes (Signed)
This encounter was created in error - please disregard.

## 2023-10-01 ENCOUNTER — Other Ambulatory Visit: Payer: Self-pay

## 2023-10-01 ENCOUNTER — Other Ambulatory Visit: Payer: Self-pay | Admitting: Nurse Practitioner

## 2023-10-29 ENCOUNTER — Inpatient Hospital Stay (HOSPITAL_BASED_OUTPATIENT_CLINIC_OR_DEPARTMENT_OTHER): Payer: BC Managed Care – PPO | Admitting: Hematology

## 2023-10-29 ENCOUNTER — Other Ambulatory Visit: Payer: Self-pay

## 2023-10-29 ENCOUNTER — Encounter: Payer: Self-pay | Admitting: Hematology

## 2023-10-29 ENCOUNTER — Inpatient Hospital Stay: Payer: BC Managed Care – PPO | Attending: Hematology

## 2023-10-29 VITALS — BP 128/84 | HR 67 | Temp 97.3°F | Resp 16 | Wt 170.8 lb

## 2023-10-29 DIAGNOSIS — Y839 Surgical procedure, unspecified as the cause of abnormal reaction of the patient, or of later complication, without mention of misadventure at the time of the procedure: Secondary | ICD-10-CM | POA: Diagnosis not present

## 2023-10-29 DIAGNOSIS — C50812 Malignant neoplasm of overlapping sites of left female breast: Secondary | ICD-10-CM | POA: Diagnosis present

## 2023-10-29 DIAGNOSIS — T8544XA Capsular contracture of breast implant, initial encounter: Secondary | ICD-10-CM | POA: Diagnosis not present

## 2023-10-29 DIAGNOSIS — Z9012 Acquired absence of left breast and nipple: Secondary | ICD-10-CM | POA: Insufficient documentation

## 2023-10-29 DIAGNOSIS — Z170 Estrogen receptor positive status [ER+]: Secondary | ICD-10-CM

## 2023-10-29 DIAGNOSIS — Z79810 Long term (current) use of selective estrogen receptor modulators (SERMs): Secondary | ICD-10-CM | POA: Insufficient documentation

## 2023-10-29 LAB — CMP (CANCER CENTER ONLY)
ALT: 24 U/L (ref 0–44)
AST: 20 U/L (ref 15–41)
Albumin: 4.5 g/dL (ref 3.5–5.0)
Alkaline Phosphatase: 100 U/L (ref 38–126)
Anion gap: 4 — ABNORMAL LOW (ref 5–15)
BUN: 15 mg/dL (ref 6–20)
CO2: 33 mmol/L — ABNORMAL HIGH (ref 22–32)
Calcium: 9.7 mg/dL (ref 8.9–10.3)
Chloride: 104 mmol/L (ref 98–111)
Creatinine: 0.87 mg/dL (ref 0.44–1.00)
GFR, Estimated: >60 mL/min (ref >60)
Glucose, Bld: 95 mg/dL (ref 70–99)
Potassium: 4.9 mmol/L (ref 3.5–5.1)
Sodium: 141 mmol/L (ref 135–145)
Total Bilirubin: 0.4 mg/dL (ref <1.2)
Total Protein: 7.3 g/dL (ref 6.5–8.1)

## 2023-10-29 LAB — CBC WITH DIFFERENTIAL (CANCER CENTER ONLY)
Abs Immature Granulocytes: 0.01 10*3/uL (ref 0.00–0.07)
Basophils Absolute: 0 10*3/uL (ref 0.0–0.1)
Basophils Relative: 1 %
Eosinophils Absolute: 0.1 10*3/uL (ref 0.0–0.5)
Eosinophils Relative: 2 %
HCT: 39.9 % (ref 36.0–46.0)
Hemoglobin: 13.5 g/dL (ref 12.0–15.0)
Immature Granulocytes: 0 %
Lymphocytes Relative: 27 %
Lymphs Abs: 1.2 10*3/uL (ref 0.7–4.0)
MCH: 31 pg (ref 26.0–34.0)
MCHC: 33.8 g/dL (ref 30.0–36.0)
MCV: 91.5 fL (ref 80.0–100.0)
Monocytes Absolute: 0.6 10*3/uL (ref 0.1–1.0)
Monocytes Relative: 12 %
Neutro Abs: 2.7 10*3/uL (ref 1.7–7.7)
Neutrophils Relative %: 58 %
Platelet Count: 233 10*3/uL (ref 150–400)
RBC: 4.36 MIL/uL (ref 3.87–5.11)
RDW: 12.9 % (ref 11.5–15.5)
WBC Count: 4.7 10*3/uL (ref 4.0–10.5)
nRBC: 0 % (ref 0.0–0.2)

## 2023-10-29 NOTE — Progress Notes (Signed)
Megan Kent   Telephone:(336) [phone] Fax:(336) [phone]   Clinic Follow up Note   Patient Care Team: [name], MD as PCP - General (Family Medicine) [name], MD as Attending Physician (Radiation Oncology) [name], RN as Oncology Nurse Navigator [name], RN as Oncology Nurse Navigator [name], MD as Consulting Physician (Hematology) [name], MD as Referring Physician (Breast Surgery) [name], NP as Nurse Practitioner (Nurse Practitioner) [name], MD as Referring Physician (Plastic Surgery)  Date of Service:  10/29/2023  CHIEF COMPLAINT: f/u of left breast cancer  CURRENT THERAPY:  Surveillance  Oncology History   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  -she started antiestrogen therapy  (Aanastrozole first, then exemestane) on 03/17/20, did not tolerated well and switched to tamoxifen in January 2022. -Tamoxifen dose reduced to 10mg  daily in April 2022 due to side effects -She reported memory loss, fatigue, and insomnia with intermittent Tamoxifen and Tamoxifen was stopped in June 2023     Assessment and Plan    Breast Cancer Follow-up 57 year old with breast cancer, experiencing fatigue and sleep disturbances. Stopped tamoxifen in June 2023, improving brain fog but noticed recent increasing fatigue. No current pain. Discussed good sleep and exercise for fatigue management. Informed about capsular contracture and surgical options. Discussed Signatera blood test for circulating tumor DNA with a 90% detection rate for recurrence, potential anxiety, and need for whole-body scans. Patient willing to proceed. - Recommend melatonin 5 mg for sleep - Encourage regular exercise, consider home  equipment - Refer to Dr. Wardell Honour for capsular contracture evaluation and surgical options - Order breast MRI due to dense breast tissue - Discuss Signatera blood test for early recurrence detection - Schedule follow-up in six months  Capsular Contracture Reports discomfort and implant shifting. Considering DIEP flap surgery but concerned about blood clot risk. Seeking another opinion. Discussed blood thinners post-surgery due to clot history. Informed about DIEP flap surgery risks and benefits, including natural appearance and extensive nature. - Refer to Dr. Clarisse Gouge and Dr. Ulice Bold for evaluation and surgical options - Discuss need for blood thinners post-surgery due to clot history  General Health Maintenance Routine health maintenance discussed. Last mammogram in June 2023. Emphasized importance of routine mammograms and MRI due to dense breast tissue. - Order breast MRI - Continue routine mammograms as scheduled  Follow-up - Schedule follow-up in six months - Set up initial lab draw for Signatera blood test at home - Ensure follow-up with plastic surgeons for capsular contracture evaluation.         SUMMARY OF ONCOLOGIC HISTORY: Oncology History Overview Note  Cancer Staging Cancer of overlapping sites of left female breast [facility]) Staging form: Breast, AJCC 8th Edition - Pathologic stage from 06/24/2019: Stage IA (pT1c, pN1a, cM0, G2, ER+, PR+, HER2-, Oncotype DX score: 25) - Signed by [name], MD on 08/01/2019    Cancer of overlapping sites of left female breast (HCC)  05/12/2019 Mammogram   Mammogram and [place] Left breast 05/12/19 left breast reveals a hypoechoic solid lesion with irregular margins and associated shadowing measuring 16 x 12 mm at the upper inner quadrant. Less prominent hypoechoic solid lesion is present at the superior  retroareolar region measuring 8 x 6 mm.     05/19/2019 Initial Biopsy  05/19/19 1.  Left breast, superior, needle core biopsy: Invasive  ductal carcinoma, Grade I of III, at least 1.0 cm in this limited sample. 2.  Left breast, upper inner quadrant, needle core biopsy: Invasive ductal carcinoma, Grade I of III, at least 0.8 cm in this limited sample.                     In the context of the patient's IHC results (2+), the HER2 is interpreted as NEGATIVE Estrogen Receptor (ER) Positive Strong 99  Progesterone receptor (PR) Positive Intermediate 1    06/06/2019 Breast MRI   B/l Breast MRI 06/06/19  Evidence of multicentric carcinoma in left breast, measuring up to at least 6.5 cm in extent. This encompasses the 2 biopsied lesions located medially and laterally as well as additional nonbiopsied suspicious masses and nonmass enhancement.  No evidence of adenopathy. The right breast shows no significant abnormality.   06/24/2019 Cancer Staging   Staging form: Breast, AJCC 8th Edition - Pathologic stage from 06/24/2019: Stage IB (pT2, pN1a, cM0, G2, ER+, PR+, HER2-, Oncotype DX score: 25) - Signed by [name], MD on 08/10/2019   06/24/2019 Surgery   Left skin and nipple-sparing total mastectomy, left axillary slnb 06/24/2019  Immediate tissue expander reconstruction (Dr. Deforest Hoyles)  1.     Breast, left, mastectomy:             Multifocal invasive ductal carcinoma (3 foci)                Invasive foci measure 1.1 x 0.8 cm  (grade 1); 1.9 x 1.7 (grade 2); 1.1 cm (grade 2).             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             Tumor impinges upon and sometimes involves the margin in the anterior aspect.                                                      2) - Breast, Nipple Duct, within left nipple                                                       3) - Lymph Node, Sentinel, Left axillary sentinel node level 1                                     4) - Lymph Node, Sentinel, Level 2 sentinel node       07/01/2019 Imaging   Doppler 07/01/19   The examination is positive for DVT in the left distal main femoral,  popliteal, and posterior tibial veins.                   08/01/2019 Initial Diagnosis   Cancer of overlapping sites of left female breast (HCC)   08/12/2019 - 10/17/2019 Chemotherapy   TC every 2 weeks for 4 cycles starting 08/12/19. Dose reduced with C2 due to neutropenic fever was given cipro. Last cycle on 10/17/19.    11/22/2019 Surgery  Left breast resection of nipple areolar complex by Dr. Jerelyn Scott At [facility]   Breast tissue/left nipple areolar complex, excision:  Small focus of residual invasive ductal carcinoma, grade 1.  Tumor measures 0.3 cm (3 mm) in greatest dimension.  Examined surgical margins are free of involvement.  Closest margin is posterior or deep at approximately 6 mm.  Surrounding breast tissue shows reactive changes/fibrosis consistent  with prior surgical procedure    01/05/2020 - 02/20/2020 Radiation Therapy   Adjuvant Radiation with Dr. Basilio Cairo 01/05/20-02/20/20   03/17/2020 -  Anti-estrogen oral therapy   Anastrozole once daily starting 03/17/20.           -Due to worsened joint pain she was switched to Exemestane in 08/2020. Due to concerns for worsening joint pain stopped Exemestane in 10/2020.           -Started Tamoxifen in 11/2020. Developed severe fatigue.  Reduced tamoxifen dose in 02/2021    07/04/2020 Survivorship   SCP delivered by Santiago Glad, NP   07/18/2020 Surgery   RIGHT BREAST REDUCTION FOR SYMMETRY by Dr Wyline Mood at [facility]        Discussed the use of AI scribe software for clinical note transcription with the patient, who gave verbal consent to proceed.  History of Present Illness   Megan Kent, a 57 year old female with a history of breast cancer, presents with increasing fatigue. She reports that her energy levels seemed to improve during the summer but have declined in the fall. She attributes this to a combination of personal stressors, including family issues and assisting with her father's recovery from a triple bypass surgery. 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  Ruben also reports difficulty with sleep, specifically falling asleep. She has tried over-the-counter remedies like melatonin occasionally but is hesitant to use them regularly. She acknowledges that her physical activity levels have decreased recently, which she attributes to her fatigue and the colder weather.  In addition to fatigue, Tal expresses discomfort due to capsular contracture from her breast implant. She has consulted with her plastic surgeon about potentially undergoing a DIEP flap procedure but is concerned about the risk of blood clots, given her history. She is considering seeking a second opinion about other potential options to address her discomfort.         All other systems were reviewed with the patient and are negative.  MEDICAL HISTORY:  Past Medical History:  Diagnosis Date   Asthma    she does not need to take medication   Cancer [facility])     SURGICAL HISTORY: Past Surgical History:  Procedure Laterality Date   MASTECTOMY Left 06/24/2019   with [facility]    I have reviewed the social history and family history with the patient and they are unchanged from previous note.  ALLERGIES:  has no known allergies.  MEDICATIONS:  Current Outpatient Medications  Medication Sig Dispense Refill   escitalopram (LEXAPRO) 20 MG tablet TAKE 1 TABLET DAILY 90 tablet 3   No current facility-administered medications for this visit.    PHYSICAL EXAMINATION: ECOG PERFORMANCE STATUS: 0 - Asymptomatic  Vitals:   10/29/23 1100  BP: 128/84  Pulse: 67  Resp: 16  Temp: (!) 97.3 F (36.3 C)  SpO2: 98%   Wt Readings from Last 3 Encounters:  10/29/23 178 lb 12.8 oz (81.1 kg)  04/27/23 165 lb 8 oz (75.1 kg)  10/20/22 171 lb 14.4 oz (  78 kg)     GENERAL:alert, no distress and comfortable SKIN: skin color, texture, turgor are normal, no rashes or significant  lesions EYES: normal, Conjunctiva are pink and non-injected, sclera clear NECK: supple, thyroid normal size, non-tender, without nodularity LYMPH:  no palpable lymphadenopathy in the cervical, axillary  LUNGS: clear to auscultation and percussion with normal breathing effort HEART: regular rate & rhythm and no murmurs and no lower extremity edema ABDOMEN:abdomen soft, non-tender and normal bowel sounds Musculoskeletal:no cyanosis of digits and no clubbing  NEURO: alert & oriented x 3 with fluent speech, no focal motor/sensory deficits BREAST: Left breast with implant, hard, no palpable nodule.  Right breast exam benign except very well-healed incision and implant.  No axillary adenopathy.  LABORATORY DATA:  I have reviewed the data as listed    Latest Ref Rng & Units 10/29/2023   10:31 AM 04/27/2023   11:10 AM 10/20/2022    9:19 AM  CBC  WBC 4.0 - 10.5 K/uL 4.7  3.6  4.1   Hemoglobin 12.0 - 15.0 g/dL 13.0  86.5  78.4   Hematocrit 36.0 - 46.0 % 39.9  39.6  39.1   Platelets 150 - 400 K/uL 233  220  244         Latest Ref Rng & Units 10/29/2023   10:31 AM 04/27/2023   11:10 AM 10/20/2022    9:19 AM  CMP  Glucose 70 - 99 mg/dL 95  696  98   BUN 6 - 20 mg/dL 15  16  17    Creatinine 0.44 - 1.00 mg/dL 2.95  2.84  1.32   Sodium 135 - 145 mmol/L 141  141  142   Potassium 3.5 - 5.1 mmol/L 4.9  4.0  4.2   Chloride 98 - 111 mmol/L 104  105  105   CO2 22 - 32 mmol/L 33  31  30   Calcium 8.9 - 10.3 mg/dL 9.7  9.3  9.8   Total Protein 6.5 - 8.1 g/dL 7.3  7.2  7.5   Total Bilirubin <1.2 mg/dL 0.4  0.4  0.4   Alkaline Phos 38 - 126 U/L 100  95  96   AST 15 - 41 U/L 20  18  23    ALT 0 - 44 U/L 24  20  40       RADIOGRAPHIC STUDIES: I have personally reviewed the radiological images as listed and agreed with the findings in the report. No results found.    Orders Placed This Encounter  Procedures   Ambulatory referral to Plastic Surgery    Referral Priority:   Routine    Referral  Type:   Surgical    Referral Reason:   Specialty Services Required    Requested Specialty:   Plastic Surgery    Number of Visits Requested:   1   All questions were answered. The patient knows to call the clinic with any problems, questions or concerns. No barriers to learning was detected. The total time spent in the appointment was 30 minutes.     [name], MD 10/29/2023

## 2023-10-29 NOTE — Assessment & Plan Note (Signed)
-  Stage IB, pmT2N1aM0, ER+/PR+, HER2-, Grade I-II, RS 25 -diagnosed in 05/2019, s/p left mastectomy on 06/24/19, 4 cycles of adjuvant TC, left nipple resection surgery on 11/22/19 and Adjuvant Radiation 12/26/19-02/20/20.  -she started antiestrogen therapy  (Aanastrozole first, then exemestane) on 03/17/20, did not tolerated well and switched to tamoxifen in January 2022. -Tamoxifen dose reduced to 10mg  daily in April 2022 due to side effects -She reported memory loss, fatigue, and insomnia with intermittent Tamoxifen and Tamoxifen was stopped in June 2023

## 2023-10-30 ENCOUNTER — Encounter: Payer: Self-pay | Admitting: Hematology

## 2023-10-30 ENCOUNTER — Telehealth: Payer: Self-pay

## 2023-10-30 ENCOUNTER — Other Ambulatory Visit: Payer: Self-pay

## 2023-10-30 DIAGNOSIS — Z170 Estrogen receptor positive status [ER+]: Secondary | ICD-10-CM

## 2023-10-30 LAB — CANCER ANTIGEN 27.29: CA 27.29: 11.1 U/mL (ref 0.0–38.6)

## 2023-10-30 NOTE — Telephone Encounter (Signed)
Per MD [name] request, CMA successfully faxed [employer]ater[employer] request to [phone]

## 2023-11-15 ENCOUNTER — Other Ambulatory Visit: Payer: Self-pay | Admitting: Nurse Practitioner

## 2023-11-15 DIAGNOSIS — C50812 Malignant neoplasm of overlapping sites of left female breast: Secondary | ICD-10-CM

## 2023-11-26 ENCOUNTER — Encounter: Payer: Self-pay | Admitting: Hematology

## 2023-12-04 LAB — SIGNATERA ONLY (NATERA MANAGED)
SIGNATERA MTM READOUT: 0 MTM/ml
SIGNATERA TEST RESULT: NEGATIVE

## 2023-12-08 ENCOUNTER — Other Ambulatory Visit: Payer: Self-pay | Admitting: Nurse Practitioner

## 2023-12-08 DIAGNOSIS — Z170 Estrogen receptor positive status [ER+]: Secondary | ICD-10-CM

## 2023-12-11 ENCOUNTER — Telehealth: Payer: Self-pay | Admitting: *Deleted

## 2023-12-11 NOTE — Telephone Encounter (Signed)
-----   Message from [name] sent at 12/11/2023  1:22 PM EST ----- Please let pt know her negative result, good news, thx   [name]

## 2023-12-11 NOTE — Telephone Encounter (Signed)
Per Dr.Feng, called pt with message below. Pt was appreciative and verbalized understanding.

## 2024-01-25 ENCOUNTER — Encounter: Payer: Self-pay | Admitting: Hematology

## 2024-02-04 ENCOUNTER — Other Ambulatory Visit: Payer: Self-pay

## 2024-02-09 ENCOUNTER — Other Ambulatory Visit: Payer: Self-pay

## 2024-02-23 ENCOUNTER — Other Ambulatory Visit: Payer: Self-pay

## 2024-04-27 ENCOUNTER — Other Ambulatory Visit: Payer: Self-pay

## 2024-04-27 DIAGNOSIS — Z170 Estrogen receptor positive status [ER+]: Secondary | ICD-10-CM

## 2024-04-27 NOTE — Progress Notes (Signed)
 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  -she started antiestrogen therapy  (Aanastrozole first, then exemestane ) on 03/17/20, did not tolerated well and switched to tamoxifen  in January 2022. -Tamoxifen  dose reduced to 10mg  daily in April 2022 due to side effects.  -She reported memory loss, fatigue, and insomnia with intermittent Tamoxifen  and Tamoxifen  was stopped in June 2023  -ctDNA Signatera test in January 2025 was negative.  New ctDNA testing drawn on Tuesday.  Results will be returned in approximately 2 weeks. -Bilateral breast MRI done 12/23/2023 was benign.  Findings consistent with left implant capsulitis. -New referral made to plastic surgeon Dr. Gilles Lacks.  - Mammogram scheduled for 04/29/2024. -Plan to follow-up in 6 months, sooner if needed.   Capsular contracture of left breast implant Patient initially referred to plastic surgery in 2020 for to discuss repair of left breast implant.  At the time, she was interested in flap procedure.  After discussing with surgeon who did initial implant, was found that she is not a candidate to have the flap surgery done.  She would now like new  referral to plastic surgeons associated with [facility].  Referred to Dr. Orin Birk, DO.  Staff message was sent to her and her staff asking them to reach out and schedule consultation with patient.  Plan Labs reviewed -CBC and CMP are unremarkable. - CA 27-29 is pending. Patient scheduled for mammogram tomorrow.  Bilateral breast MRI done February 2025 was benign. ctDNA Signatera testing drawn Tuesday.  Will take approximately 2 weeks for results.  Results from January 2025 were negative. Plan to see patient back in 6 months, sooner if needed.  The patient understands the plans discussed today and is in agreement with them.  She knows to contact our office if she develops concerns prior to her next appointment.  I provided 30 minutes of face-to-face time during this encounter and > 50% was spent counseling as documented under my assessment and plan.    Sharyon Deis, NP  [facility] - A DEPT OF [facility] [address] [place] [address] Dept: [phone] Dept Fax: [phone]   Orders Placed This Encounter  Procedures   Ambulatory referral to Plastic Surgery    Referral Priority:   Routine    Referral Type:   Surgical    Referral Reason:   Specialty Services Required    Referred to Provider:   Thornell Flirt, DO    Requested Specialty:   Plastic Surgery    Number of Visits Requested:   1      CHIEF COMPLAINT:  CC: Left breast cancer, estrogen receptor positive  Current Treatment: Surveillance  INTERVAL HISTORY:  Megan Kent is here today for repeat clinical  assessment.  She was last seen by Lacie, NP on 10/29/2023.  She has been intolerant to anastrozole , exemestane , and tamoxifen .  Signatera testing done January 2025 and was negative.  New  ctDNA Signatera testing drawn this past Tuesday.  Results should be returned in approximately 2 weeks.  BIlateral breast MRI was performed on 12/23/2023 with  benign results.  Findings were consistent with left breast implant capsulitis.  Referrals were made to plastic surgery in December 2024.  Initially, so she saw plastic surgeon that did initial implant surgery.  Since she was not found to be a good candidate for a flap procedure, she would like referral to plastic surgeon in [facility].  She reports feeling well in general.  She denies chest pain, chest pressure, or shortness of breath. She denies headaches or visual disturbances. She denies abdominal pain, nausea, vomiting, or changes in bowel or bladder habits.  She denies fevers or chills. She denies pain. Her appetite is good. Her weight has been stable.  I have reviewed the past medical history, past surgical history, social history and family history with the patient and they are unchanged from previous note.  ALLERGIES:  has no known allergies.  MEDICATIONS:  Current Outpatient Medications  Medication Sig Dispense Refill   aspirin EC 81 MG tablet Take 81 mg by mouth daily. Swallow whole.     escitalopram  (LEXAPRO ) 20 MG tablet TAKE 1 TABLET DAILY 90 tablet 3   No current facility-administered medications for this visit.    HISTORY OF PRESENT ILLNESS:   Oncology History Overview Note  Cancer Staging Cancer of overlapping sites of left female breast [facility]) Staging form: Breast, AJCC 8th Edition - Pathologic stage from 06/24/2019: Stage IA (pT1c, pN1a, cM0, G2, ER+, PR+, HER2-, Oncotype DX score: 25) - Signed by Sonja Goodrich, MD on 08/01/2019    Cancer of overlapping sites of left female breast (HCC)  05/12/2019 Mammogram   Mammogram and US  Left breast 05/12/19 left breast reveals a hypoechoic solid lesion with irregular margins and associated shadowing measuring 16 x 12 mm at the upper inner quadrant. Less prominent hypoechoic solid lesion is present at the superior  retroareolar region measuring 8 x 6 mm.     05/19/2019 Initial Biopsy   05/19/19 1.  Left breast, superior, needle  core biopsy: Invasive ductal carcinoma, Grade I of III, at least 1.0 cm in this limited sample. 2.  Left breast, upper inner quadrant, needle core biopsy: Invasive ductal carcinoma, Grade I of III, at least 0.8 cm in this limited sample.                     In the context of the patient's IHC results (2+), the HER2 is interpreted as NEGATIVE Estrogen Receptor (ER) Positive Strong 99  Progesterone receptor (PR) Positive Intermediate 1    06/06/2019 Breast MRI   B/l Breast MRI 06/06/19  Evidence of multicentric carcinoma in left breast, measuring up to at least 6.5 cm in extent. This encompasses the 2 biopsied lesions located medially and laterally as well as additional nonbiopsied suspicious masses and nonmass enhancement.  No evidence of adenopathy. The right breast shows no significant abnormality.   06/24/2019 Cancer Staging   Staging form: Breast, AJCC 8th Edition - Pathologic stage from 06/24/2019: Stage IB (pT2, pN1a, cM0, G2, ER+, PR+, HER2-, Oncotype DX score: 25) - Signed by Sonja Wright, MD on 08/10/2019   06/24/2019 Surgery   Left skin and nipple-sparing total mastectomy, left axillary  slnb 06/24/2019  Immediate tissue expander reconstruction (Dr. Karle Ovens)  1.     Breast, left, mastectomy:             Multifocal invasive ductal carcinoma (3 foci)                Invasive foci measure 1.1 x 0.8 cm  (grade 1); 1.9 x 1.7 (grade 2); 1.1 cm (grade 2).             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             Tumor impinges upon and sometimes involves the margin in the anterior aspect.                                                      2) - Breast, Nipple Duct, within left nipple                                                       3) - Lymph Node, Sentinel, Left axillary sentinel node level 1                                     4) - Lymph Node, Sentinel, Level 2 sentinel node       07/01/2019 Imaging   Doppler 07/01/19   The examination is positive for DVT in the left distal  main femoral, popliteal, and posterior tibial veins.                   08/01/2019 Initial Diagnosis   Cancer of overlapping sites of left female breast (HCC)   08/12/2019 - 10/17/2019 Chemotherapy   TC every 2 weeks for 4 cycles starting 08/12/19. Dose reduced with C2 due to neutropenic fever was given cipro . Last cycle on 10/17/19.    11/22/2019 Surgery   Left breast resection of nipple areolar complex by Dr. Tjoe [facility]   Breast tissue/left nipple areolar complex, excision:  Small focus of residual invasive ductal carcinoma, grade 1.  Tumor measures 0.3 cm (3 mm) in greatest dimension.  Examined surgical margins are free of involvement.  Closest margin is posterior or deep at approximately 6 mm.  Surrounding breast tissue shows reactive changes/fibrosis consistent  with prior surgical procedure    01/05/2020 - 02/20/2020 Radiation Therapy   Adjuvant Radiation with Dr. Lurena Sally 01/05/20-02/20/20   03/17/2020 -  Anti-estrogen oral therapy   Anastrozole  once daily starting 03/17/20.           -Due to worsened joint pain she was switched to Exemestane  in 08/2020. Due to concerns for worsening joint pain stopped Exemestane  in 10/2020.           -Started Tamoxifen  in 11/2020. Developed severe fatigue.  Reduced tamoxifen  dose in 02/2021    07/04/2020 Survivorship   SCP delivered by Lacie Burton, NP   07/18/2020 Surgery   RIGHT BREAST REDUCTION FOR SYMMETRY by Dr Amanda Jungling at [facility]         REVIEW OF SYSTEMS:   Constitutional: Denies fevers, chills or abnormal weight loss Eyes:  Denies blurriness of vision Ears, nose, mouth, throat, and face: Denies mucositis or sore throat Respiratory: Denies cough, dyspnea or wheezes Cardiovascular: Denies palpitation, chest discomfort or lower extremity swelling Gastrointestinal:  Denies nausea, heartburn or change in bowel habits Skin: Denies abnormal skin rashes Lymphatics: Denies new lymphadenopathy or easy bruising Neurological:Denies  numbness, tingling or new weaknesses Behavioral/Psych: Mood is stable, no new changes  All other systems were reviewed with the patient and are negative.   VITALS:   Today's Vitals   04/28/24 1031 04/28/24 1032  BP: 110/78   Pulse: 66   Resp: 18   Temp: 98.4 F (36.9 C)   TempSrc: Temporal   SpO2: 99%   Weight: 169 lb 8 oz (76.9 kg)   PainSc:  0-No pain   Body mass index is 27.36 kg/m.   Wt Readings from Last 3 Encounters:  04/28/24 169 lb 8 oz (76.9 kg)  10/29/23 170 lb 12.8 oz (77.5 kg)  04/27/23 165 lb 8 oz (75.1 kg)    Body mass index is 27.36 kg/m.  Performance status (ECOG): 1 - Symptomatic but completely ambulatory  PHYSICAL EXAM:   GENERAL:alert, no distress and comfortable SKIN: skin color, texture, turgor are normal, no rashes or significant lesions EYES: normal, Conjunctiva are pink and non-injected, sclera clear OROPHARYNX:no exudate, no erythema and lips, buccal mucosa, and tongue normal  NECK: supple, thyroid normal size, non-tender, without nodularity LYMPH:  no palpable lymphadenopathy in the cervical, axillary or inguinal LUNGS: clear to auscultation and percussion with normal breathing effort HEART: regular rate & rhythm and no murmurs and no lower extremity edema ABDOMEN:abdomen soft, non-tender and normal bowel sounds Musculoskeletal:no cyanosis of digits and no clubbing  NEURO: alert & oriented x 3 with fluent speech, no focal motor/sensory deficits BREAST: left breast implant present. Very firm, with smooth contour. Non tender. No warmth.  The nipple is surgically absent.  There are no palpable masses or lumps around the chest wall.  There is no axillary lymphadenopathy present on the left.  The right breast has well-healed inferior surgical scars.  There are are no palpable lumps or masses in the right breast.  There is no nipple inversion or nipple discharge.  There is no axillary lymphadenopathy on the right.  LABORATORY DATA:  I have reviewed  the data as listed    Component Value Date/Time   NA 143 04/28/2024 1005   K 4.2 04/28/2024 1005   CL 107 04/28/2024 1005   CO2 29 04/28/2024 1005   GLUCOSE 99 04/28/2024 1005   BUN 16 04/28/2024 1005   CREATININE 0.91 04/28/2024 1005   CALCIUM 9.3 04/28/2024 1005   PROT 7.1 04/28/2024 1005   ALBUMIN 4.4 04/28/2024 1005   AST 18 04/28/2024 1005   ALT 20 04/28/2024 1005   ALKPHOS 82 04/28/2024 1005   BILITOT 0.4 04/28/2024 1005   GFRNONAA >60 04/28/2024 1005   GFRAA >60 02/20/2020 1511    Lab Results  Component Value Date   WBC 4.1 04/28/2024   NEUTROABS 2.3 04/28/2024   HGB 13.3 04/28/2024   HCT 39.7 04/28/2024   MCV 89.0 04/28/2024   PLT 228 04/28/2024

## 2024-04-27 NOTE — Assessment & Plan Note (Addendum)
-  Stage IB, pmT2N1aM0, ER+/PR+, HER2-, Grade I-II, RS 25 -diagnosed in 05/2019, s/p left mastectomy on 06/24/19, 4 cycles of adjuvant TC, left nipple resection surgery on 11/22/19 and Adjuvant Radiation 12/26/19-02/20/20.  -she started antiestrogen therapy  (Aanastrozole first, then exemestane ) on 03/17/20, did not tolerated well and switched to tamoxifen  in January 2022. -Tamoxifen  dose reduced to 10mg  daily in April 2022 due to side effects.  -She reported memory loss, fatigue, and insomnia with intermittent Tamoxifen  and Tamoxifen  was stopped in June 2023  -ctDNA Signatera test in January 2025 was negative.  New ctDNA testing drawn on Tuesday.  Results will be returned in approximately 2 weeks. -Bilateral breast MRI done 12/23/2023 was benign.  Findings consistent with left implant capsulitis. -New referral made to plastic surgeon Dr. Gilles Lacks.  - Mammogram scheduled for 04/29/2024. -Plan to follow-up in 6 months, sooner if needed.

## 2024-04-28 ENCOUNTER — Inpatient Hospital Stay: Payer: BC Managed Care – PPO

## 2024-04-28 ENCOUNTER — Inpatient Hospital Stay: Payer: BC Managed Care – PPO | Attending: Hematology | Admitting: Nurse Practitioner

## 2024-04-28 VITALS — BP 110/78 | HR 66 | Temp 98.4°F | Resp 18 | Wt 169.5 lb

## 2024-04-28 DIAGNOSIS — Z170 Estrogen receptor positive status [ER+]: Secondary | ICD-10-CM

## 2024-04-28 DIAGNOSIS — Z9882 Breast implant status: Secondary | ICD-10-CM | POA: Diagnosis not present

## 2024-04-28 DIAGNOSIS — T8544XA Capsular contracture of breast implant, initial encounter: Secondary | ICD-10-CM

## 2024-04-28 DIAGNOSIS — C50812 Malignant neoplasm of overlapping sites of left female breast: Secondary | ICD-10-CM | POA: Insufficient documentation

## 2024-04-28 DIAGNOSIS — Z79810 Long term (current) use of selective estrogen receptor modulators (SERMs): Secondary | ICD-10-CM | POA: Diagnosis not present

## 2024-04-28 DIAGNOSIS — Z923 Personal history of irradiation: Secondary | ICD-10-CM | POA: Diagnosis not present

## 2024-04-28 LAB — CMP (CANCER CENTER ONLY)
ALT: 20 U/L (ref 0–44)
AST: 18 U/L (ref 15–41)
Albumin: 4.4 g/dL (ref 3.5–5.0)
Alkaline Phosphatase: 82 U/L (ref 38–126)
Anion gap: 7 (ref 5–15)
BUN: 16 mg/dL (ref 6–20)
CO2: 29 mmol/L (ref 22–32)
Calcium: 9.3 mg/dL (ref 8.9–10.3)
Chloride: 107 mmol/L (ref 98–111)
Creatinine: 0.91 mg/dL (ref 0.44–1.00)
GFR, Estimated: >60 mL/min (ref >60)
Glucose, Bld: 99 mg/dL (ref 70–99)
Potassium: 4.2 mmol/L (ref 3.5–5.1)
Sodium: 143 mmol/L (ref 135–145)
Total Bilirubin: 0.4 mg/dL (ref 0.0–1.2)
Total Protein: 7.1 g/dL (ref 6.5–8.1)

## 2024-04-28 LAB — CBC WITH DIFFERENTIAL (CANCER CENTER ONLY)
Abs Immature Granulocytes: 0.01 10*3/uL (ref 0.00–0.07)
Basophils Absolute: 0 10*3/uL (ref 0.0–0.1)
Basophils Relative: 1 %
Eosinophils Absolute: 0.1 10*3/uL (ref 0.0–0.5)
Eosinophils Relative: 2 %
HCT: 39.7 % (ref 36.0–46.0)
Hemoglobin: 13.3 g/dL (ref 12.0–15.0)
Immature Granulocytes: 0 %
Lymphocytes Relative: 30 %
Lymphs Abs: 1.2 10*3/uL (ref 0.7–4.0)
MCH: 29.8 pg (ref 26.0–34.0)
MCHC: 33.5 g/dL (ref 30.0–36.0)
MCV: 89 fL (ref 80.0–100.0)
Monocytes Absolute: 0.4 10*3/uL (ref 0.1–1.0)
Monocytes Relative: 10 %
Neutro Abs: 2.3 10*3/uL (ref 1.7–7.7)
Neutrophils Relative %: 57 %
Platelet Count: 228 10*3/uL (ref 150–400)
RBC: 4.46 MIL/uL (ref 3.87–5.11)
RDW: 13 % (ref 11.5–15.5)
WBC Count: 4.1 10*3/uL (ref 4.0–10.5)
nRBC: 0 % (ref 0.0–0.2)

## 2024-04-29 ENCOUNTER — Encounter: Payer: Self-pay | Admitting: Nurse Practitioner

## 2024-04-29 LAB — CANCER ANTIGEN 27.29: CA 27.29: 15.8 U/mL (ref 0.0–38.6)

## 2024-05-03 ENCOUNTER — Other Ambulatory Visit: Payer: Self-pay

## 2024-05-06 ENCOUNTER — Other Ambulatory Visit: Payer: Self-pay

## 2024-05-06 LAB — SIGNATERA
SIGNATERA MTM READOUT: 0 MTM/ml
SIGNATERA TEST RESULT: NEGATIVE

## 2024-05-10 ENCOUNTER — Telehealth: Payer: Self-pay | Admitting: Nurse Practitioner

## 2024-05-10 NOTE — Telephone Encounter (Signed)
 Scheduled appointments per 6/12 los. Called and left a VM with appointment details for the patient.

## 2024-06-13 ENCOUNTER — Ambulatory Visit (INDEPENDENT_AMBULATORY_CARE_PROVIDER_SITE_OTHER): Admitting: Plastic Surgery

## 2024-06-13 DIAGNOSIS — T8544XA Capsular contracture of breast implant, initial encounter: Secondary | ICD-10-CM | POA: Insufficient documentation

## 2024-06-13 DIAGNOSIS — I824Y9 Acute embolism and thrombosis of unspecified deep veins of unspecified proximal lower extremity: Secondary | ICD-10-CM

## 2024-06-13 DIAGNOSIS — Z853 Personal history of malignant neoplasm of breast: Secondary | ICD-10-CM

## 2024-06-13 DIAGNOSIS — Z170 Estrogen receptor positive status [ER+]: Secondary | ICD-10-CM | POA: Diagnosis not present

## 2024-06-13 DIAGNOSIS — N651 Disproportion of reconstructed breast: Secondary | ICD-10-CM | POA: Diagnosis not present

## 2024-06-13 DIAGNOSIS — Z86718 Personal history of other venous thrombosis and embolism: Secondary | ICD-10-CM

## 2024-06-13 DIAGNOSIS — Z923 Personal history of irradiation: Secondary | ICD-10-CM

## 2024-06-13 NOTE — Progress Notes (Signed)
 Patient ID: Megan Kent, female    DOB: [birth year], 58 y.o.   MRN: [id number]   Chief Complaint  Patient presents with   Advice Only   Skin Problem    The patient is a 58 year old female here for evaluation of her breasts.  The patient was diagnosed with left breast cancer in 2020 and underwent a left mastectomy.  The patient describes having a nipple sparing mastectomy.  She then had positive margins and had to go back for a completion mastectomy which included removal of the nipple areolar complex.  She was reconstructed and had radiation.  She has a 545 cc implant in on the left that looks to be a Allergan implant SRX 545 and she has a natural real 190 cc implant in on the right.  Over the last year the patient was trying to decide if she wanted a Diep flap or capsulectomy.  The patient went for a second opinion at [facility] for a Diep flap with plastics and then vascular surgery and was denied the procedure based on her history of deep venous thrombosis.  This information is per the patient's description.  She has noticed tightening of the left breast over the past year to the point where it is extremely uncomfortable and does not have a good shape.  She understands the risks of autologous reconstruction because of her history.  She would like input on options for making what she has feel and look better.  The left breast is quite tight.  She does not have symmetry with the right breast being at least 1 cup size larger than the left.  The left breast does appear soft.    Review of Systems  Constitutional: Negative.   HENT: Negative.    Eyes: Negative.   Respiratory: Negative.    Cardiovascular: Negative.   Gastrointestinal: Negative.   Endocrine: Negative.   Genitourinary: Negative.   Musculoskeletal: Negative.   Psychiatric/Behavioral: Negative.      Past Medical History:  Diagnosis Date   Asthma    she does not need to take medication   Cancer [facility])     Past Surgical  History:  Procedure Laterality Date   MASTECTOMY Left 06/24/2019   with [facility]      Current Outpatient Medications:    aspirin EC 81 MG tablet, Take 81 mg by mouth daily. Swallow whole., Disp: , Rfl:    escitalopram  (LEXAPRO ) 20 MG tablet, TAKE 1 TABLET DAILY, Disp: 90 tablet, Rfl: 3   Objective:   There were no vitals filed for this visit.  Physical Exam Vitals reviewed.  Constitutional:      Appearance: Normal appearance.  Cardiovascular:     Rate and Rhythm: Normal rate.     Pulses: Normal pulses.  Pulmonary:     Breath sounds: Normal breath sounds.  Abdominal:     Palpations: Abdomen is soft.  Musculoskeletal:        General: Deformity present.  Skin:    General: Skin is warm.     Capillary Refill: Capillary refill takes less than 2 seconds.     Coloration: Skin is not jaundiced.     Findings: No bruising or lesion.  Neurological:     Mental Status: She is oriented to person, place, and time.  Psychiatric:        Mood and Affect: Mood normal.        Behavior: Behavior normal.        Thought Content: Thought content  normal.        Judgment: Judgment normal.     Assessment & Plan:  Malignant neoplasm of overlapping sites of left breast in female, estrogen receptor positive (HCC)  Acute deep vein thrombosis (DVT) of proximal vein of lower extremity, unspecified laterality (HCC)  History of DVT of lower extremity  Capsular contracture of breast implant, initial encounter  We discussed options for a latissimus muscle flap but I do not hold that as a great option due to her history.  We also talked about trying to achieve the best result with the minimal risks.  She could have the right implant removed with a mastopexy in order to have better symmetry and then have a capsulectomy on the left with downsizing of the implant.  At the moment that is what she is thinking about.  In addition if the implant on the left is under the muscle transitioning it to above the  muscle is also an option.  Will know more once we get the operative report.  If needed an implant on the right could be placed back but smaller.   Pictures were obtained of the patient and placed in the chart with the patient's or guardian's permission.   Estefana RAMAN Naeema Patlan, DO

## 2024-06-30 ENCOUNTER — Telehealth: Payer: Self-pay

## 2024-06-30 NOTE — Telephone Encounter (Signed)
 Request for the op note pertaining to patients implants were faxed to [facility] with confirmed receipt.  Report received and scanned in chart.

## 2024-07-12 ENCOUNTER — Ambulatory Visit (INDEPENDENT_AMBULATORY_CARE_PROVIDER_SITE_OTHER): Admitting: Plastic Surgery

## 2024-07-12 DIAGNOSIS — T8544XD Capsular contracture of breast implant, subsequent encounter: Secondary | ICD-10-CM

## 2024-07-12 DIAGNOSIS — N651 Disproportion of reconstructed breast: Secondary | ICD-10-CM

## 2024-07-12 DIAGNOSIS — T8544XA Capsular contracture of breast implant, initial encounter: Secondary | ICD-10-CM

## 2024-07-12 NOTE — Progress Notes (Signed)
   Subjective:    Patient ID: Megan Kent, female    DOB: October 09, 1966, 58 y.o.   MRN: [id number]  The patient is a 58 year old female joining me by phone.  She was diagnosed with breast cancer in 2020.  She had a left nipple sparing mastectomy which later was transitioned to removal of the nipple areola due to margins.  She had reconstruction with an implant and then went on to have radiation.  She has a 545 cc implant in on the right breast that is Allergan and she has 190 cc implant in on the right breast that was placed for symmetry.  She wanted to undergo a Diep flap and sought evaluation for that but was denied from the vascular standpoint due to her history of deep venous thrombosis.  The patient is seeing me because she has got a lot of tightness of the left breast that is getting worse in time.  She is hoping to get that tightness released and have better symmetry.  She is about 1 cup size difference from left to right.  We spoke in the clinic and decided to talk again on the phone because there was a lot of information given.  The patient is still thinking over her options.  I was able to confirm that her current implant is under the muscle on the left breast.    Review of Systems  Constitutional: Negative.   Eyes: Negative.   Respiratory: Negative.    Cardiovascular: Negative.   Gastrointestinal: Negative.   Endocrine: Negative.   Genitourinary: Negative.   Musculoskeletal: Negative.        Objective:   Physical Exam        Assessment & Plan:     ICD-10-CM   1. Capsular contracture of breast implant, initial encounter  T85.44XA        The patient would like another month to think things over.  She also is aware that she gained a little bit of weight so she may want to try to lose that prior to the surgery for better long-term symmetry.  She is thinking about right breast implant removal with mastopexy and then left breast implant exchange to smaller versus latissimus  flap.  Will plan to talk in another month.  I connected with  Leeroy ONEIDA Parker on 07/12/24 by phone and verified that I am speaking with the correct person using two identifiers.  We spent 10 minutes in discussion.  The patient was at home and I was at the office.   I discussed the limitations of evaluation and management by telemedicine. The patient expressed understanding and agreed to proceed.

## 2024-08-08 ENCOUNTER — Telehealth: Payer: Self-pay | Admitting: Nurse Practitioner

## 2024-08-08 NOTE — Telephone Encounter (Signed)
 Rescheduled patients appointments on 12/12. Called and left voicemail with new appointment day and times.

## 2024-09-26 ENCOUNTER — Other Ambulatory Visit: Payer: Self-pay | Admitting: Nurse Practitioner

## 2024-09-29 ENCOUNTER — Encounter: Payer: Self-pay | Admitting: Hematology

## 2024-09-30 ENCOUNTER — Other Ambulatory Visit: Payer: Self-pay

## 2024-09-30 DIAGNOSIS — C50812 Malignant neoplasm of overlapping sites of left female breast: Secondary | ICD-10-CM

## 2024-09-30 DIAGNOSIS — T8544XA Capsular contracture of breast implant, initial encounter: Secondary | ICD-10-CM

## 2024-09-30 NOTE — Progress Notes (Signed)
 Referral packet for surgery faxed to [facility].  Fax confirmation received.

## 2024-10-25 ENCOUNTER — Other Ambulatory Visit: Payer: Self-pay | Admitting: Nurse Practitioner

## 2024-10-25 DIAGNOSIS — Z170 Estrogen receptor positive status [ER+]: Secondary | ICD-10-CM

## 2024-10-25 NOTE — Progress Notes (Unsigned)
 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  -she started antiestrogen therapy  (Aanastrozole first, then exemestane ) on 03/17/20, did not tolerated well and switched to tamoxifen  in January 2022. -Tamoxifen  dose reduced to 10mg  daily in April 2022 due to side effects.  -She reported memory loss, fatigue, and insomnia with intermittent Tamoxifen  and Tamoxifen  was stopped in June 2023  -ctDNA Signatera test in January 2025 was negative.  New ctDNA testing drawn on Tuesday.  Results will be returned in approximately 2 weeks. -Bilateral breast MRI done 12/23/2023 was benign.  Findings consistent with left implant capsulitis. -New referral made to plastic surgeon Dr. Estefana Fritter.  - Mammogram scheduled for 04/29/2024. -Plan to follow-up in 6 months, sooner if needed.    The patient understands the plans discussed today and is in agreement with them.  She knows to contact our office if she develops concerns prior to her next appointment.  I provided *** minutes of face-to-face time during this encounter and > 50% was spent counseling as documented under my assessment and plan.    Powell FORBES Lessen, NP  [facility]  - A DEPT OF [facility] [address] [place] [address] Dept: [phone] Dept Fax: [phone]   No orders of the defined types were placed in this encounter.     CHIEF COMPLAINT:  CC: Left breast cancer, ER +  Current Treatment: Surveillance  INTERVAL HISTORY:  Talitha is here today for repeat clinical assessment.  She last saw me on 04/28/2024.  She has been intolerant of tamoxifen , anastrozole , and exemestane .  Most recent 3D screening mammogram of right breast right breast was done 04/29/2024.  Results were benign.  Previous to that, MRI from February 2025 showed capsular contracture of the left breast.  She was referred to plastic surgery for management.  She denies fevers or chills. She denies pain. Her appetite is good. Her weight {Weight change:10426}.  I have reviewed the past medical history, past surgical history, social history and family history with the patient and they are unchanged from previous note.  ALLERGIES:  has no known allergies.  MEDICATIONS:  Current Outpatient Medications  Medication Sig Dispense Refill   aspirin EC 81 MG tablet Take 81 mg by mouth daily. Swallow whole.     escitalopram  (LEXAPRO ) 20 MG tablet TAKE 1 TABLET DAILY 90 tablet 3   No current facility-administered medications for this visit.    HISTORY OF PRESENT ILLNESS:   Oncology History Overview Note  Cancer Staging Cancer of overlapping sites of left female breast [facility]) Staging form: Breast, AJCC 8th Edition - Pathologic stage from 06/24/2019: Stage IA (pT1c, pN1a, cM0, G2, ER+, PR+, HER2-, Oncotype DX score: 25) - Signed by Lanny Callander, MD on 08/01/2019    Cancer of  overlapping sites of left female breast (HCC)  05/12/2019 Mammogram   Mammogram and US  Left breast 05/12/19 left breast reveals a hypoechoic solid lesion with irregular margins and associated shadowing measuring 16 x 12 mm at the upper inner quadrant. Less prominent hypoechoic solid lesion is  present at the superior  retroareolar region measuring 8 x 6 mm.     05/19/2019 Initial Biopsy   05/19/19 1.  Left breast, superior, needle core biopsy: Invasive ductal carcinoma, Grade I of III, at least 1.0 cm in this limited sample. 2.  Left breast, upper inner quadrant, needle core biopsy: Invasive ductal carcinoma, Grade I of III, at least 0.8 cm in this limited sample.                     In the context of the patient's IHC results (2+), the HER2 is interpreted as NEGATIVE Estrogen Receptor (ER) Positive Strong 99  Progesterone receptor (PR) Positive Intermediate 1    06/06/2019 Breast MRI   B/l Breast MRI 06/06/19  Evidence of multicentric carcinoma in left breast, measuring up to at least 6.5 cm in extent. This encompasses the 2 biopsied lesions located medially and laterally as well as additional nonbiopsied suspicious masses and nonmass enhancement.  No evidence of adenopathy. The right breast shows no significant abnormality.   06/24/2019 Cancer Staging   Staging form: Breast, AJCC 8th Edition - Pathologic stage from 06/24/2019: Stage IB (pT2, pN1a, cM0, G2, ER+, PR+, HER2-, Oncotype DX score: 25) - Signed by Lanny Callander, MD on 08/10/2019   06/24/2019 Surgery   Left skin and nipple-sparing total mastectomy, left axillary slnb 06/24/2019  Immediate tissue expander reconstruction (Dr. Sonny Ross)  1.     Breast, left, mastectomy:             Multifocal invasive ductal carcinoma (3 foci)                Invasive foci measure 1.1 x 0.8 cm  (grade 1); 1.9 x 1.7 (grade 2); 1.1 cm (grade 2).             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             Tumor impinges upon and sometimes involves the margin in the anterior aspect.                                                      2) - Breast, Nipple Duct, within left nipple                                                       3) - Lymph Node, Sentinel, Left axillary sentinel node level 1                                     4) - Lymph  Node, Sentinel, Level 2 sentinel node       07/01/2019 Imaging   Doppler 07/01/19   The examination is positive for DVT in the left distal main femoral, popliteal, and posterior tibial veins.  08/01/2019 Initial Diagnosis   Cancer of overlapping sites of left female breast (HCC)   08/12/2019 - 10/17/2019 Chemotherapy   TC every 2 weeks for 4 cycles starting 08/12/19. Dose reduced with C2 due to neutropenic fever was given cipro . Last cycle on 10/17/19.    11/22/2019 Surgery   Left breast resection of nipple areolar complex by Dr. Tjoe At [facility]   Breast tissue/left nipple areolar complex, excision:  Small focus of residual invasive ductal carcinoma, grade 1.  Tumor measures 0.3 cm (3 mm) in greatest dimension.  Examined surgical margins are free of involvement.  Closest margin is posterior or deep at approximately 6 mm.  Surrounding breast tissue shows reactive changes/fibrosis consistent  with prior surgical procedure    01/05/2020 - 02/20/2020 Radiation Therapy   Adjuvant Radiation with Dr. Izell 01/05/20-02/20/20   03/17/2020 -  Anti-estrogen oral therapy   Anastrozole  once daily starting 03/17/20.           -Due to worsened joint pain she was switched to Exemestane  in 08/2020. Due to concerns for worsening joint pain stopped Exemestane  in 10/2020.           -Started Tamoxifen  in 11/2020. Developed severe fatigue.  Reduced tamoxifen  dose in 02/2021    07/04/2020 Survivorship   SCP delivered by Lacie Burton, NP   07/18/2020 Surgery   RIGHT BREAST REDUCTION FOR SYMMETRY by Dr Alvan at [facility]         REVIEW OF SYSTEMS:   Constitutional: Denies fevers, chills or abnormal weight loss Eyes: Denies blurriness of vision Ears, nose, mouth, throat, and face: Denies mucositis or sore throat Respiratory: Denies cough, dyspnea or wheezes Cardiovascular: Denies palpitation, chest discomfort or lower extremity swelling Gastrointestinal:  Denies nausea, heartburn or  change in bowel habits Skin: Denies abnormal skin rashes Lymphatics: Denies new lymphadenopathy or easy bruising Neurological:Denies numbness, tingling or new weaknesses Behavioral/Psych: Mood is stable, no new changes  All other systems were reviewed with the patient and are negative.   VITALS:  There were no vitals taken for this visit.  Wt Readings from Last 3 Encounters:  04/28/24 169 lb 8 oz (76.9 kg)  10/29/23 170 lb 12.8 oz (77.5 kg)  04/27/23 165 lb 8 oz (75.1 kg)    There is no height or weight on file to calculate BMI.  Performance status (ECOG): {CHL ONC [id number]  PHYSICAL EXAM:   GENERAL:alert, no distress and comfortable SKIN: skin color, texture, turgor are normal, no rashes or significant lesions EYES: normal, Conjunctiva are pink and non-injected, sclera clear OROPHARYNX:no exudate, no erythema and lips, buccal mucosa, and tongue normal  NECK: supple, thyroid normal size, non-tender, without nodularity LYMPH:  no palpable lymphadenopathy in the cervical, axillary or inguinal LUNGS: clear to auscultation and percussion with normal breathing effort HEART: regular rate & rhythm and no murmurs and no lower extremity edema ABDOMEN:abdomen soft, non-tender and normal bowel sounds Musculoskeletal:no cyanosis of digits and no clubbing  NEURO: alert & oriented x 3 with fluent speech, no focal motor/sensory deficits  LABORATORY DATA:  I have reviewed the data as listed    Component Value Date/Time   NA 143 04/28/2024 1005   K 4.2 04/28/2024 1005   CL 107 04/28/2024 1005   CO2 29 04/28/2024 1005   GLUCOSE 99 04/28/2024 1005   BUN 16 04/28/2024 1005   CREATININE 0.91 04/28/2024 1005   CALCIUM 9.3 04/28/2024 1005   PROT 7.1 04/28/2024 1005   ALBUMIN 4.4 04/28/2024 1005   AST  18 04/28/2024 1005   ALT 20 04/28/2024 1005   ALKPHOS 82 04/28/2024 1005   BILITOT 0.4 04/28/2024 1005   GFRNONAA >60 04/28/2024 1005   GFRAA >60 02/20/2020 1511    No results found  for: SPEP, UPEP  Lab Results  Component Value Date   WBC 4.1 04/28/2024   NEUTROABS 2.3 04/28/2024   HGB 13.3 04/28/2024   HCT 39.7 04/28/2024   MCV 89.0 04/28/2024   PLT 228 04/28/2024      Chemistry      Component Value Date/Time   NA 143 04/28/2024 1005   K 4.2 04/28/2024 1005   CL 107 04/28/2024 1005   CO2 29 04/28/2024 1005   BUN 16 04/28/2024 1005   CREATININE 0.91 04/28/2024 1005      Component Value Date/Time   CALCIUM 9.3 04/28/2024 1005   ALKPHOS 82 04/28/2024 1005   AST 18 04/28/2024 1005   ALT 20 04/28/2024 1005   BILITOT 0.4 04/28/2024 1005       RADIOGRAPHIC STUDIES: I have personally reviewed the radiological images as listed and agreed with the findings in the report. No results found.

## 2024-10-25 NOTE — Assessment & Plan Note (Signed)
-  Stage IB, pmT2N1aM0, ER+/PR+, HER2-, Grade I-II, RS 25 -diagnosed in 05/2019, s/p left mastectomy on 06/24/19, 4 cycles of adjuvant TC, left nipple resection surgery on 11/22/19 and Adjuvant Radiation 12/26/19-02/20/20.  -she started antiestrogen therapy  (Aanastrozole first, then exemestane ) on 03/17/20, did not tolerated well and switched to tamoxifen  in January 2022. -Tamoxifen  dose reduced to 10mg  daily in April 2022 due to side effects.  -She reported memory loss, fatigue, and insomnia with intermittent Tamoxifen  and Tamoxifen  was stopped in June 2023  -ctDNA Signatera test in January 2025 was negative.  New ctDNA testing drawn on Tuesday.  Results will be returned in approximately 2 weeks. -Bilateral breast MRI done 12/23/2023 was benign.  Findings consistent with left implant capsulitis. -New referral made to plastic surgeon Dr. Gilles Lacks.  - Mammogram scheduled for 04/29/2024. -Plan to follow-up in 6 months, sooner if needed.

## 2024-10-26 ENCOUNTER — Encounter: Payer: Self-pay | Admitting: Nurse Practitioner

## 2024-10-26 ENCOUNTER — Other Ambulatory Visit: Payer: Self-pay

## 2024-10-26 ENCOUNTER — Ambulatory Visit: Admitting: Nurse Practitioner

## 2024-10-26 ENCOUNTER — Inpatient Hospital Stay: Attending: Nurse Practitioner

## 2024-10-26 DIAGNOSIS — Z170 Estrogen receptor positive status [ER+]: Secondary | ICD-10-CM | POA: Diagnosis not present

## 2024-10-26 DIAGNOSIS — C50812 Malignant neoplasm of overlapping sites of left female breast: Secondary | ICD-10-CM

## 2024-10-26 DIAGNOSIS — Z923 Personal history of irradiation: Secondary | ICD-10-CM | POA: Insufficient documentation

## 2024-10-26 DIAGNOSIS — Z853 Personal history of malignant neoplasm of breast: Secondary | ICD-10-CM | POA: Diagnosis present

## 2024-10-26 LAB — CMP (CANCER CENTER ONLY)
ALT: 18 U/L (ref 0–44)
AST: 22 U/L (ref 15–41)
Albumin: 4.5 g/dL (ref 3.5–5.0)
Alkaline Phosphatase: 92 U/L (ref 38–126)
Anion gap: 10 (ref 5–15)
BUN: 15 mg/dL (ref 6–20)
CO2: 27 mmol/L (ref 22–32)
Calcium: 9.5 mg/dL (ref 8.9–10.3)
Chloride: 106 mmol/L (ref 98–111)
Creatinine: 0.91 mg/dL (ref 0.44–1.00)
GFR, Estimated: >60 mL/min (ref >60)
Glucose, Bld: 104 mg/dL — ABNORMAL HIGH (ref 70–99)
Potassium: 4 mmol/L (ref 3.5–5.1)
Sodium: 142 mmol/L (ref 135–145)
Total Bilirubin: 0.3 mg/dL (ref 0.0–1.2)
Total Protein: 7.2 g/dL (ref 6.5–8.1)

## 2024-10-26 LAB — CBC WITH DIFFERENTIAL (CANCER CENTER ONLY)
Abs Immature Granulocytes: 0.01 K/uL (ref 0.00–0.07)
Basophils Absolute: 0 K/uL (ref 0.0–0.1)
Basophils Relative: 1 %
Eosinophils Absolute: 0.1 K/uL (ref 0.0–0.5)
Eosinophils Relative: 2 %
HCT: 40.7 % (ref 36.0–46.0)
Hemoglobin: 13.7 g/dL (ref 12.0–15.0)
Immature Granulocytes: 0 %
Lymphocytes Relative: 31 %
Lymphs Abs: 1.2 K/uL (ref 0.7–4.0)
MCH: 30 pg (ref 26.0–34.0)
MCHC: 33.7 g/dL (ref 30.0–36.0)
MCV: 89.3 fL (ref 80.0–100.0)
Monocytes Absolute: 0.4 K/uL (ref 0.1–1.0)
Monocytes Relative: 10 %
Neutro Abs: 2.3 K/uL (ref 1.7–7.7)
Neutrophils Relative %: 56 %
Platelet Count: 239 K/uL (ref 150–400)
RBC: 4.56 MIL/uL (ref 3.87–5.11)
RDW: 13.2 % (ref 11.5–15.5)
WBC Count: 4 K/uL (ref 4.0–10.5)
nRBC: 0 % (ref 0.0–0.2)

## 2024-10-26 LAB — GENETIC SCREENING ORDER

## 2024-10-27 LAB — CANCER ANTIGEN 27.29: CA 27.29: 18.8 U/mL (ref 0.0–38.6)

## 2024-10-28 ENCOUNTER — Other Ambulatory Visit

## 2024-10-28 ENCOUNTER — Ambulatory Visit: Admitting: Nurse Practitioner

## 2024-11-14 ENCOUNTER — Other Ambulatory Visit: Payer: Self-pay

## 2024-11-14 ENCOUNTER — Ambulatory Visit: Payer: Self-pay | Admitting: Nurse Practitioner

## 2024-11-14 LAB — SIGNATERA
SIGNATERA MTM READOUT: 0 MTM/ml
SIGNATERA TEST RESULT: NEGATIVE

## 2024-12-07 ENCOUNTER — Other Ambulatory Visit: Payer: Self-pay | Admitting: *Deleted

## 2024-12-07 ENCOUNTER — Encounter: Payer: Self-pay | Admitting: Nurse Practitioner

## 2024-12-07 ENCOUNTER — Other Ambulatory Visit: Payer: Self-pay

## 2024-12-07 ENCOUNTER — Telehealth: Payer: Self-pay | Admitting: *Deleted

## 2024-12-08 NOTE — Telephone Encounter (Signed)
 Open in error

## 2025-10-27 ENCOUNTER — Inpatient Hospital Stay

## 2025-10-27 ENCOUNTER — Inpatient Hospital Stay: Admitting: Nurse Practitioner
